# Patient Record
Sex: Male | Born: 1950 | Race: Black or African American | Hispanic: No | Marital: Married | Smoking: Never smoker
Health system: Southern US, Community
[De-identification: ages and names within clinical notes are randomized; demographics above are authoritative.]

## PROBLEM LIST (undated history)

## (undated) DIAGNOSIS — Z5189 Encounter for other specified aftercare: Secondary | ICD-10-CM

## (undated) DIAGNOSIS — I10 Essential (primary) hypertension: Secondary | ICD-10-CM

## (undated) DIAGNOSIS — Z940 Kidney transplant status: Secondary | ICD-10-CM

## (undated) DIAGNOSIS — E119 Type 2 diabetes mellitus without complications: Secondary | ICD-10-CM

## (undated) DIAGNOSIS — H269 Unspecified cataract: Secondary | ICD-10-CM

## (undated) HISTORY — DX: Encounter for other specified aftercare: Z51.89

## (undated) HISTORY — DX: Kidney transplant status: Z94.0

## (undated) HISTORY — PX: COLONOSCOPY: SHX174

## (undated) HISTORY — DX: Essential (primary) hypertension: I10

## (undated) HISTORY — DX: Unspecified cataract: H26.9

## (undated) HISTORY — DX: Type 2 diabetes mellitus without complications: E11.9

---

## 1997-09-09 ENCOUNTER — Other Ambulatory Visit: Admission: RE | Admit: 1997-09-09 | Discharge: 1997-09-09 | Payer: Self-pay | Admitting: *Deleted

## 1997-09-11 ENCOUNTER — Other Ambulatory Visit: Admission: RE | Admit: 1997-09-11 | Discharge: 1997-09-11 | Payer: Self-pay | Admitting: *Deleted

## 1997-09-12 ENCOUNTER — Emergency Department (HOSPITAL_COMMUNITY): Admission: EM | Admit: 1997-09-12 | Discharge: 1997-09-12 | Payer: Self-pay | Admitting: Emergency Medicine

## 1997-09-24 ENCOUNTER — Other Ambulatory Visit: Admission: RE | Admit: 1997-09-24 | Discharge: 1997-09-24 | Payer: Self-pay | Admitting: *Deleted

## 2001-03-19 ENCOUNTER — Inpatient Hospital Stay (HOSPITAL_COMMUNITY): Admission: AD | Admit: 2001-03-19 | Discharge: 2001-03-22 | Payer: Self-pay | Admitting: Nephrology

## 2001-03-20 ENCOUNTER — Encounter: Payer: Self-pay | Admitting: Nephrology

## 2001-11-07 ENCOUNTER — Ambulatory Visit (HOSPITAL_COMMUNITY): Admission: RE | Admit: 2001-11-07 | Discharge: 2001-11-07 | Payer: Self-pay | Admitting: Nephrology

## 2001-11-07 ENCOUNTER — Encounter: Payer: Self-pay | Admitting: Nephrology

## 2002-04-08 ENCOUNTER — Ambulatory Visit (HOSPITAL_COMMUNITY): Admission: RE | Admit: 2002-04-08 | Discharge: 2002-04-08 | Payer: Self-pay | Admitting: Nephrology

## 2002-04-08 ENCOUNTER — Encounter: Payer: Self-pay | Admitting: Nephrology

## 2002-04-09 ENCOUNTER — Encounter: Payer: Self-pay | Admitting: Vascular Surgery

## 2002-04-09 ENCOUNTER — Ambulatory Visit (HOSPITAL_COMMUNITY): Admission: RE | Admit: 2002-04-09 | Discharge: 2002-04-09 | Payer: Self-pay | Admitting: Vascular Surgery

## 2002-06-03 ENCOUNTER — Encounter: Admission: RE | Admit: 2002-06-03 | Discharge: 2002-06-03 | Payer: Self-pay | Admitting: Nephrology

## 2002-06-03 ENCOUNTER — Encounter: Payer: Self-pay | Admitting: Nephrology

## 2002-07-29 ENCOUNTER — Inpatient Hospital Stay (HOSPITAL_COMMUNITY): Admission: RE | Admit: 2002-07-29 | Discharge: 2002-08-01 | Payer: Self-pay | Admitting: Urology

## 2002-07-29 ENCOUNTER — Encounter (INDEPENDENT_AMBULATORY_CARE_PROVIDER_SITE_OTHER): Payer: Self-pay | Admitting: *Deleted

## 2002-07-31 ENCOUNTER — Encounter: Payer: Self-pay | Admitting: Urology

## 2002-10-02 ENCOUNTER — Encounter: Payer: Self-pay | Admitting: Nephrology

## 2002-10-02 ENCOUNTER — Ambulatory Visit (HOSPITAL_COMMUNITY): Admission: RE | Admit: 2002-10-02 | Discharge: 2002-10-02 | Payer: Self-pay | Admitting: Nephrology

## 2004-10-11 ENCOUNTER — Ambulatory Visit (HOSPITAL_COMMUNITY): Admission: RE | Admit: 2004-10-11 | Discharge: 2004-10-11 | Payer: Self-pay

## 2004-10-12 ENCOUNTER — Observation Stay (HOSPITAL_COMMUNITY): Admission: EM | Admit: 2004-10-12 | Discharge: 2004-10-13 | Payer: Self-pay | Admitting: Emergency Medicine

## 2004-10-12 ENCOUNTER — Ambulatory Visit: Payer: Self-pay | Admitting: Internal Medicine

## 2004-10-18 ENCOUNTER — Ambulatory Visit: Payer: Self-pay

## 2004-12-13 ENCOUNTER — Ambulatory Visit (HOSPITAL_COMMUNITY): Admission: RE | Admit: 2004-12-13 | Discharge: 2004-12-13 | Payer: Self-pay | Admitting: Nephrology

## 2005-01-24 ENCOUNTER — Ambulatory Visit (HOSPITAL_COMMUNITY): Admission: RE | Admit: 2005-01-24 | Discharge: 2005-01-24 | Payer: Self-pay | Admitting: Vascular Surgery

## 2005-02-21 ENCOUNTER — Ambulatory Visit: Payer: Self-pay | Admitting: Gastroenterology

## 2005-04-04 ENCOUNTER — Ambulatory Visit: Payer: Self-pay | Admitting: Gastroenterology

## 2005-05-18 ENCOUNTER — Emergency Department (HOSPITAL_COMMUNITY): Admission: EM | Admit: 2005-05-18 | Discharge: 2005-05-19 | Payer: Self-pay | Admitting: Emergency Medicine

## 2005-05-24 ENCOUNTER — Encounter: Admission: RE | Admit: 2005-05-24 | Discharge: 2005-07-20 | Payer: Self-pay | Admitting: Family Medicine

## 2005-05-25 ENCOUNTER — Encounter: Admission: RE | Admit: 2005-05-25 | Discharge: 2005-05-25 | Payer: Self-pay | Admitting: Family Medicine

## 2005-05-30 ENCOUNTER — Ambulatory Visit: Payer: Self-pay | Admitting: Gastroenterology

## 2005-07-06 ENCOUNTER — Ambulatory Visit (HOSPITAL_COMMUNITY): Admission: RE | Admit: 2005-07-06 | Discharge: 2005-07-06 | Payer: Self-pay | Admitting: Vascular Surgery

## 2005-07-25 ENCOUNTER — Ambulatory Visit (HOSPITAL_COMMUNITY): Admission: RE | Admit: 2005-07-25 | Discharge: 2005-07-25 | Payer: Self-pay | Admitting: Vascular Surgery

## 2006-07-31 ENCOUNTER — Encounter: Admission: RE | Admit: 2006-07-31 | Discharge: 2006-07-31 | Payer: Self-pay | Admitting: Family Medicine

## 2006-09-18 ENCOUNTER — Ambulatory Visit (HOSPITAL_COMMUNITY): Admission: RE | Admit: 2006-09-18 | Discharge: 2006-09-18 | Payer: Self-pay | Admitting: Nephrology

## 2006-11-13 ENCOUNTER — Encounter: Payer: Self-pay | Admitting: Nephrology

## 2006-11-13 ENCOUNTER — Ambulatory Visit: Payer: Self-pay | Admitting: Vascular Surgery

## 2006-11-13 ENCOUNTER — Ambulatory Visit (HOSPITAL_COMMUNITY): Admission: RE | Admit: 2006-11-13 | Discharge: 2006-11-13 | Payer: Self-pay | Admitting: Vascular Surgery

## 2006-11-23 ENCOUNTER — Ambulatory Visit: Payer: Self-pay | Admitting: Vascular Surgery

## 2006-12-06 ENCOUNTER — Ambulatory Visit (HOSPITAL_COMMUNITY): Admission: RE | Admit: 2006-12-06 | Discharge: 2006-12-06 | Payer: Self-pay | Admitting: Vascular Surgery

## 2006-12-14 ENCOUNTER — Ambulatory Visit: Payer: Self-pay | Admitting: Vascular Surgery

## 2007-01-10 ENCOUNTER — Ambulatory Visit: Payer: Self-pay | Admitting: Vascular Surgery

## 2007-01-10 ENCOUNTER — Ambulatory Visit (HOSPITAL_COMMUNITY): Admission: RE | Admit: 2007-01-10 | Discharge: 2007-01-10 | Payer: Self-pay | Admitting: Vascular Surgery
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
## 2007-02-25 ENCOUNTER — Ambulatory Visit (HOSPITAL_COMMUNITY): Admission: RE | Admit: 2007-02-25 | Discharge: 2007-02-25 | Payer: Self-pay | Admitting: Nephrology

## 2007-04-25 DIAGNOSIS — Z940 Kidney transplant status: Secondary | ICD-10-CM

## 2007-04-25 HISTORY — DX: Kidney transplant status: Z94.0

## 2007-06-18 ENCOUNTER — Ambulatory Visit: Payer: Self-pay

## 2007-07-31 ENCOUNTER — Ambulatory Visit (HOSPITAL_COMMUNITY): Admission: RE | Admit: 2007-07-31 | Discharge: 2007-07-31 | Payer: Self-pay | Admitting: Nephrology

## 2007-08-16 HISTORY — PX: KIDNEY TRANSPLANT: SHX239

## 2008-05-19 ENCOUNTER — Ambulatory Visit (HOSPITAL_COMMUNITY): Admission: RE | Admit: 2008-05-19 | Discharge: 2008-05-19 | Payer: Self-pay | Admitting: Nephrology

## 2008-12-03 ENCOUNTER — Encounter: Admission: RE | Admit: 2008-12-03 | Discharge: 2008-12-22 | Payer: Self-pay | Admitting: Nephrology

## 2010-09-06 NOTE — Op Note (Signed)
NAMETEODOR, PRATER                ACCOUNT NO.:  [id number]   MEDICAL RECORD NO.:  [id number]          PATIENT TYPE:  AMB   LOCATION:  SDS                          FACILITY:  [facility]   PHYSICIAN:  Larina Earthly, M.D.    DATE OF BIRTH:  1950/06/25   DATE OF PROCEDURE:  11/13/2006  DATE OF DISCHARGE:  11/13/2006                               OPERATIVE REPORT   PREOPERATIVE DIAGNOSIS:  End stage renal disease with occluded nonviable  left upper arm arteriovenous fistula.   POSTOPERATIVE DIAGNOSIS:  End stage renal disease with occluded  nonviable left upper arm arteriovenous fistula.   PROCEDURE:  Placement of left internal jugular Diatek catheter with  ultrasound visualization.   SURGEON:  Larina Earthly, M.D.   ASSISTANT:  Nurse   ANESTHESIA:  MAC.   COMPLICATIONS:  None.   DISPOSITION:  To the recovery room stable with chest x-ray pending.   PROCEDURE IN DETAIL:  The patient was taken to the operating room and  placed in the supine position where the area of the right and left neck  were imaged with ultrasound.  The patient had patent jugular veins  bilaterally.  On reviewing a prior right venous shuntogram, the patient  was found to have a stent across his IJ on the right and, therefore, was  not a candidate for right IJ catheter.  A 32 cm catheter was chosen.  The patient was placed in Trendelenburg and, using a finder needle, the  left internal jugular vein was identified and, using Seldinger  technique, a guidewire passed down to the level of the right atrium.  The dilator and peel away sheath was passed over the guide-wire, the  dilator and guidewire were removed.  A 32 cm Diatek catheter was passed  through the peel away sheath down to the level of the right atrium.  The  catheter was brought through a subcutaneous tunnel through a separate  stab incision.  The two lumen ports were attached, both lumens flushed  and aspirated easily and were locked with 1000 per mL  heparin.  The  catheter was secured to the skin with 3-0 nylon stitch and the entry  site was closed with a 4-0 subcuticular Vicryl stitch.  A sterile  dressing was applied.  The patient was taken to the recovery room in  stable condition.      Larina Earthly, M.D.  Electronically Signed     TFE/MEDQ  D:  11/13/2006  T:  11/13/2006  Job:  [id number]

## 2010-09-06 NOTE — Op Note (Signed)
NAMESABASTIEN, TYLER                ACCOUNT NO.:  [id number]   MEDICAL RECORD NO.:  [id number]          PATIENT TYPE:  AMB   LOCATION:  SDS                          FACILITY:  [facility]   PHYSICIAN:  Larina Earthly, M.D.    DATE OF BIRTH:  19-Jan-1951   DATE OF PROCEDURE:  12/06/2006  DATE OF DISCHARGE:  12/06/2006                               OPERATIVE REPORT   PREOPERATIVE DIAGNOSIS:  End-stage renal disease with non-functioning  left upper arm arteriovenous Gore-Tex graft.   POSTOPERATIVE DIAGNOSIS:  End-stage renal disease with non-functioning  left upper arm arteriovenous Gore-Tex graft.   PROCEDURE PERFORMED:  Placement of new left forearm loop arteriovenous  Gore-Tex graft.   SURGEON:  Larina Earthly, M.D.   ASSISTANT:  Nurse.   ANESTHESIA:  Monitored anesthesia control.   COMPLICATIONS:  None.   DISPOSITION:  To recovery room stable.   PROCEDURE IN DETAIL:  The patient was taken to the operating room and  placed in the supine position where the area of the left arm was prepped  and draped in the usual sterile fashion.  An incision was made over the  prior antecubital incision and carried down to isolate the basilic vein  which was of excellent caliber and also identification of the brachial  artery with prior cephalic vein anastomosis.  The patient had an  occluded cephalic vein fistula.  The artery was encircled proximal and  distal to the old anastomosis.  A separate incision was made over the  distal forearm and a loop configuration tunnel was created and a 6 mm  standard wall stretch graft was brought through the tunnel.  The basilic  vein was occluded proximal and distally and was opened with a #11 blade  and sewn with Potts scissors.  The graft was spatulated and sewn end-to-  side to the basilic vein with a running #6-0 Prolene suture.  Clamps  were removed.  The graft was flushed with heparinized saline and  reoccluded.  Next the brachial artery was occluded  proximal and distal  to the old fistula anastomosis.  The vein was ligated proximally and the  old anastomosis was taken down.  The graft was cut to the appropriate  length and sewn end-to-side to the brachial artery with a running #6-0  Prolene suture.  Clamps were removed. An excellent thrill was noted.  The wound was irrigated with saline.  Hemostasis with electrocautery.  The wounds were closed with #3-0 Vicryl in the subcutaneous and  subcuticular tissue.  Steri-Strips were applied.     Larina Earthly, M.D.  Electronically Signed    TFE/MEDQ  D:  12/06/2006  T:  12/07/2006  Job:  [id number]

## 2010-09-06 NOTE — Assessment & Plan Note (Signed)
OFFICE VISIT   ANGAD, NABERS  DOB:  Jul 27, 1950                                       12/14/2006  JYNWG#:[id number]   Edwin Huang comes to [place] today for followup of placement of left forearm new  AV Gore-Tex graft on August 14.  He has excellent flow through this with  an excellent thrill.  He does not have any discomfort related to this.  I am quite pleased with his initial result.  He does have a Diatek  catheter and I have instructed him to wait for access for his graft from  3-4 weeks, and then access it and we can get his catheter out.   Larina Earthly, M.D.  Electronically Signed   TFE/MEDQ  D:  12/14/2006  T:  12/17/2006  Job:  [id number]   cc:   [employer]

## 2010-09-09 NOTE — H&P (Signed)
[place]  Patient:    Edwin Huang, Edwin Huang Visit Number: [id number] MRN: [id number]          Service Type: EMS Location: [facility] Attending Physician:  Donnetta Hutching Dictated by:   Irena Cords, M.D. Admit Date:  03/19/2001                           History and Physical  ADMITTING DIAGNOSIS:  Acute renal failure.  CHIEF COMPLAINT:  Nausea, vomiting, fevers, chills, and hip pain.  HISTORY OF PRESENT ILLNESS:  Mr. Broce is a 60 year old African-American male with a past medical history significant for end-stage renal disease secondary to hypertension, status post cadaver kidney transplant performed in [place], [place] in 1996.  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  He has followed up with medical care sporadically over the last two years and he now presents with a six-day history of nausea, vomiting, anorexia, fevers, chills, and diffuse arthralgias that are worse in his hips, elbows, and knees. The patient reports that it has been difficult for him to walk due to his pain but he feels very weak and tired and does not feel well.  The patient was seen at [facility] today, and his labs upon workup revealed a potassium of 6.6, BUN of 140, and a creatinine of 23.4.  He was referred to [facility] for further evaluation and management as well as hospitalization.  ALLERGIES:  PENICILLIN which causes itching.  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  Hypertension. Secondary hyperparathyroidism.  CURRENT MEDICATIONS: 1. Prograf 6 mg b.i.d. 2. Prednisone  5 mg q.d. 3. Minoxidil 5 mg b.i.d. 4. Imuran 125 mg q.d. 5. Hectorol 2.5 mg q.d.  FAMILY HISTORY:  Mother is alive at age 36 and in excellent health.  Father died at age 56 secondary to bleeding ulcer.  He has three brothers and one sister and they are all alive.  He does have a family history for hypertension and coronary disease.  No family history for diabetes or kidney disease.  SOCIAL HISTORY:  He is married with three children.  He is a Sport and exercise psychologist.  Denies tobacco, alcohol, or drug use.  He has had blood transfusions in the past.  REVIEW OF SYSTEMS:  GENERAL:  As per HPI, nausea, vomiting, anorexia, and fatigue.  OPHTHALMIC:  Denies any blurred vision, photophobia, or diplopia. HEENT:  Denies any tinnitus or hearing impairment.  CARDIAC:  Denies any palpitations, chest pain, or PND.  PULMONARY:  Denies any hemoptysis, productive cough, or shortness of breath.  GASTROINTESTINAL:  Denies any hematemesis, hematochezia, melena, or bright red blood per rectum but does have the nausea, vomiting, anorexia.  GENITOURINARY:  Denies any dysuria, pyuria, or hematuria.  No decreased urine output.  No urinary retention or urgency.  No abdominal pain.  MUSCULOSKELETAL:  As per HPI.  Diffuse arthralgias along his hips, knees, elbows, shoulders.  No swollen joints.  No erythema.  DERMATOLOGIC:  Denies any rashes or masses.  NEUROLOGIC:  Denies any numbness, tingling, or weakness.  No dysarthria.  ENDOCRINE:  Denies any polyuria, polyphagia, or polydipsia.  All other systems negative.  PHYSICAL EXAMINATION:  GENERAL:  A well-developed, frail-appearing man in no apparent distress.  VITAL SIGNS:  Temperature 97.3, pulse 86, blood pressure 111/41, respiratory rate 18.  HEENT:  Head is normocephalic and atraumatic.  Pupils are equal, round and reactive to light.  Extraocular muscles intact.  NECK:  Supple with full range of motion.  No lymphadenopathy and no bruits appreciated.  LUNGS:  Clear to  auscultation bilaterally.  No rales, rubs, or rhonchi.  CARDIOVASCULAR:  Regular rate and rhythm with an S4 gallop.  A 2/6 systolic ejection murmur at the right upper sternal border.  ABDOMEN:  Normoactive bowel sounds.  Soft.  His allograft is in the right lower quadrant and is mildly tender and enlarged.  There are no bruits appreciated over this.  EXTREMITIES:  He has no clubbing, cyanosis, or edema.  He has a functioning right forearm AV fistula.  He has pain on internal and external rotation of his hips as well as flexion and extension of his knees and elbows.  There is no erythema.  No point tenderness.  LABORATORY DATA:  Sodium of 139, potassium of 6.6, chloride 111, CO2 9, BUN 140, creatinine 23.4.  Glucose 96, calcium 9.1, phosphorus 12.2, albumin 4.5. Hemoglobin of 8.2, hematocrit 25.7, white blood cell count 9.3, platelets of 295,000, MCV of 79.  ASSESSMENT/PLAN: 1. Acute renal failure on chronic renal insufficiency, possibly secondary to    acute on chronic rejection versus worsening of chronic rejection and    hypertension:  We will continue with his Prograf and Imuran for now and    start intravenous Solu-Medrol.  Plan for urgent hemodialysis at this time    for correction of hyperkalemia and acidosis.  We will continue to follow    his laboratory values and send off his Prograf level. 2. Joint pain, diffuse:  The patient is afebrile with a normal white blood    cell counts; however, we will check blood cultures to rule out infectious    etiology.  This may be secondary to an immune reaction due to his rejection    versus elevated uric acid from the acute renal failure or avascular    necrosis secondary to prednisone therapy and immunosuppression compromised    status.  At this time, we will check x-rays of his hips as well as blood    cultures and use oxycodone as needed for pain. 3. Abdominal pain and enlarged allograft:  Worrisome for rejection.  We will    check  abdominal and pelvic computerized tomography to further evaluate    renal transplant, rule out rupture.  4. Anemia:  This is likely related to decrease renal function.  However, we    will check stool guaiacs and iron studies.  We will type and cross two    units at this time and follow his hemoglobin levels.  We will also start    Epogen therapy. 5. Secondary hyperparathyroidism:  We will start phosphate binders given his    elevated phosphorus of 12.2, put him on a low phosphorus diet and check    an intact PTH level.  We will hold his Hectorol for now and consider    intravenous Calcitriol when PTH levels have returned.  Dictated by:   Irena Cords, M.D. Attending Physician:  Donnetta Hutching DD:  03/19/01 TD:  03/19/01 Job: [id number]

## 2010-09-09 NOTE — Op Note (Signed)
NAMEAMIRR, ACHORD                ACCOUNT NO.:  [id number]   MEDICAL RECORD NO.:  [id number]          PATIENT TYPE:  AMB   LOCATION:  SDS                          FACILITY:  [facility]   PHYSICIAN:  Balinda Quails, M.D.    DATE OF BIRTH:  May 10, 1950   DATE OF PROCEDURE:  07/25/2005  DATE OF DISCHARGE:                                 OPERATIVE REPORT   SURGEON:  Balinda Quails, M.D.   ASSISTANT:  Rowe Clack, P.A.-C.   ANESTHETIC:  Local with MAC.   ANESTHESIOLOGIST:  Judie Petit, M.D.   PREOPERATIVE DIAGNOSIS:  End stage renal failure.   POSTOPERATIVE DIAGNOSIS:  End stage renal failure.   PROCEDURE:  Left brachial cephalic arteriovenous fistula.   OPERATIVE PROCEDURE:  The patient was brought to the operating room in  stable condition.  He was placed in the supine position.  The left arm was  prepped and draped in sterile fashion.  The skin and subcutaneous tissue was  instilled with 1% Xylocaine with epinephrine.  A transverse skin incision  was made through the left antecubital fossa.  Dissection was carried down  through the subcutaneous tissue.  The antecubital network of veins were  identified.  The cephalic vein was mobilized proximally in the arm.  The  cephalic vein was ligated in the antecubital fossa and divided.  This was a  large vein of good quality.  It was flushed with heparin saline solution and  controlled with a bulldog clamp.  Deep dissection was then carried down  exposing the brachial artery which was freed proximally and distally.  The  patient was administered 3000 units heparin intravenously.  The brachial  artery was controlled with bulldog clamps.  A longitudinal arteriotomy was  made.  The cephalic vein was anastomosed end-to-side to the brachial artery  using running 7-0 Prolene suture.  Clamps were removed.  Excellent flow  present.  Adequate hemostasis obtained.  Sponges counts correct.  Subcutaneous tissue closed with a running 3-0 Vicryl  suture.  Skin was  closed with 4-0 Monocryl.  Steri-Strips applied.  The patient tolerated  procedure well.  Transferred recovery in stable condition.      Balinda Quails, M.D.  Electronically Signed     PGH/MEDQ  D:  07/25/2005  T:  07/25/2005  Job:  [id number]

## 2010-09-09 NOTE — H&P (Signed)
NAMESAKIB, NOGUEZ                ACCOUNT NO.:  [id number]   MEDICAL RECORD NO.:  [id number]          PATIENT TYPE:  OBV   LOCATION:  4707                         FACILITY:  [facility]   PHYSICIAN:  Doylene Canning. Ladona Ridgel, M.D.  DATE OF BIRTH:  1951-03-14   DATE OF ADMISSION:  10/12/2004  DATE OF DISCHARGE:                                HISTORY & PHYSICAL   CHIEF COMPLAINT:  Chest pain.   HISTORY OF PRESENT ILLNESS:  The patient is a 60 year old man with a history  of end-stage renal disease on hemodialysis. This is thought secondary to  hypertension. 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 The patient was seen one day ago for revision of his right  forearm graft. Following that, he had some nausea and a funny feeling in my  stomach. He also complained of lots of gas at that time. He underwent his  typical hemodialysis today but developed epigastric and substernal chest  pain following dialysis. He notes that he had three distinct episodes of  pain, the longest of which was perhaps 5 or at most 10 minutes. These  episodes were midsternal to epigastric in the location. There was no  radiation. There was no associated nausea, vomiting or diaphoresis. There  was a questionable increase work of breathing or sensation of dyspnea. The  patient is now admitted for additional evaluation. He denies any history of  syncope.   PAST MEDICAL HISTORY:  1.  As noted above.  2.  He also has had a history of secondary hyperparathyroidism.  3.  He has a history of anemia.  4.  He has a history of peritoneal dialysis.  5.  He has a history of transplant nephropathy in 2004.  6.  He has longstanding hypertension.   ALLERGIES:  The patient gives a history of PENICILLIN allergy.   MEDICATIONS:  Nephro vitamins and Tums.   SOCIAL HISTORY:  The patient lives in [place]. He has a history of  tobacco  use, stopping smoking approximately 15 years ago. He denies alcohol  abuse.   FAMILY HISTORY:  His family history is notable for father dying of unknown  causes and a mother dying of unknown causes. He has one brother who has  coronary disease and is status post bypass surgery in his 58s.   REVIEW OF SYMPTOMS:  As noted in the HPI. In addition, he denies vision or  hearing problems. He denies nausea, vomiting, diarrhea, or constipation  except as previously noted. He denies polyuria, polydipsia, heat, or cold  intolerance. He denies any recent weight changes. He denies any skin  problems. He denies any arthritic complaints. He denies chest pain except  for what may have been mentioned previously. He denies hemoptysis, cough,  syncope, or palpitations. He denies PND or orthopnea. He denies any  neurologic complaints. The rest of his review of systems was reviewed and  found to be negative.   PHYSICAL EXAMINATION:  GENERAL:  He is a pleasant 60 year old man in no  distress.  VITAL SIGNS:  Blood pressure was 100/60, pulse was 68 and regular,  respirations were 18, temperature was 98.  HEENT:  Normocephalic and atraumatic. Pupils are equal and round. The  oropharynx is moist. Sclerae are anicteric.  NECK:  Supple with no jugular venous distention. The thyroid was not  appreciably enlarged. The carotids were 2+ and symmetric. The trachea was  midline.  LUNGS:  Clear bilaterally to auscultation. There were no wheezes, rales, or  rhonchi and no increase work of breathing.  CARDIOVASCULAR:  Regular rate and rhythm with normal S1 and S2. I did not  appreciate an S3 or S4 gallop nor any murmurs today. His PMI was not  enlarged nor laterally displaced.  ABDOMEN:  Soft, nontender, and nondistended. There was no organomegaly.  There was no rebound or guarding. Bowel sounds were present.  EXTREMITIES:  Demonstrated no clubbing, cyanosis, or edema. The pulses were  2+ and symmetric.   LABORATORY  DATA:  The EKG demonstrates sinus tachycardia at a 108 beats per  minute. There are no acute ST or T-wave abnormalities. Initial labs were  unremarkable except for a creatinine of 9.7. His initial point of care  cardiac makers were notable for a myoglobin greater than 500. His initial CK  and MB were normal.   IMPRESSION:  1.  Atypical chest pain in a patient with multiple cardiac risk factors.  2.  End-stage renal disease on hemodialysis.  3.  Status post renal transplant with transplant nephrectomy.   PLAN:  Admit the patient to the hospital and obtain serial cardiac enzymes.  If his cardiac enzymes are negative, then we will plan to discharge him home  with an outpatient stress test. If his cardiac enzymes turn positive, then  catheterization will be warranted.       GWT/MEDQ  D:  10/12/2004  T:  10/12/2004  Job:  [id number]   cc:   Tamika J. Lazarus Salines, M.D.  Fam. Med - Resident - [place], [place] [address]  Fax: [phone]

## 2010-09-09 NOTE — Op Note (Signed)
Edwin Huang, Edwin Huang                            ACCOUNT NO.:  [id number]   MEDICAL RECORD NO.:  [id number]                   PATIENT TYPE:  INP   LOCATION:  5530                                 FACILITY:  [facility]   PHYSICIAN:  Maretta Bees. Vonita Moss, M.D.             DATE OF BIRTH:  04/29/1950   DATE OF PROCEDURE:  07/29/2002  DATE OF DISCHARGE:                                 OPERATIVE REPORT   PREOPERATIVE DIAGNOSIS:  Renal transplant with rejection and hematuria.   POSTOPERATIVE DIAGNOSIS:  Renal transplant with rejection and hematuria.  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   OPERATION PERFORMED:  Transplant nephrectomy and flexible cystoscopy.   SURGEON:  Maretta Bees. Vonita Moss, M.D.   ASSISTANT:  Di Kindle. Edilia Bo, M.D.   ANESTHESIA:  General.   INDICATIONS FOR PROCEDURE:  This 60 year old black male had a renal  transplant performed at the [facility] [place] [facility] in 1995.  Over  a year ago he developed rejection with pain.  He also has had some problems  with hematuria.  He was seen in consultation for transplant nephrectomy and  that was scheduled for today.  With the hematuria it was felt that  cystoscopy as part of the evaluation was appropriate.   DESCRIPTION OF PROCEDURE:  The patient was brought to the operating room and  placed in supine position and external genitalia were prepped and draped in  the usual fashion and he was cystoscoped with a flexible scope.  The  anterior urethra and prostatic urethra were unremarkable.  The bladder had  no stones, tumors, inflammatory lesions and the cystoscope was removed.  The  lower abdomen and external genitalia were reprepped and Foley catheter  inserted.  An incision was made through his previous renal transplant  incision in the right lower quadrant.  As expected, very dense adherent  fibrosis and scarring were encountered and dissection was carried down to  the renal capsule and the anterior  surface of the kidney dissected down with  a combination of sharp and blunt dissection to what appeared to be at first  renal pelvis.  There was a renal cyst and above that there were two vessels  that were dissected out by Dr. Edilia Bo and divided and subsequently ligated  on each side with 2-0 silk tie and 5-0 vascular suture with good hemostasis.  During the course of the dissection, there was felt to be two major branches  of the renal artery.  Dissection laterally was used to mobilize the rest of  the kidney and the ureter was identified and divided.  Dissection was then  turned distally where a large venous structure was noted and it was  dissected superiorly towards this cystic renal mass in the kidney and this  mass was mobilized and dissected off the external iliac vein and the only  way it could be removed was with excision, sharp dissection using  the  Satinsky clamp and Dr. Merlene Pulling the vein at this point with running  5-0 Prolenes.  It is felt that the cystic structure was either renal cyst  that was adherent to the vein or possibly some type of distended venous  abnormality.  At this point the specimen was removed intact and his right  external iliac artery and vein were examined and found to be in situ without  any abnormalities.  At this point, the wound was irrigated and the fascial  closure was completed with running #1 PDS.  Subcutaneous tissue closed with  interrupted 3-0 chromic catgut and incision closed with skin  staples.  The wound was cleaned, dressed with dry sterile gauze dressings.  Sponge, needle and instrument counts were correct.  The estimated blood loss  was approximately 100 ml.  He was taken to recovery room in good condition  having tolerated the procedure well.                                                Maretta Bees. Vonita Moss, M.D.    LJP/MEDQ  D:  07/29/2002  T:  07/29/2002  Job:  [id number]   cc:   Di Kindle. Edilia Bo, M.D.  [address]  [place]  [place] [address]  Fax: [phone]   Duke Salvia. Eliott Nine, M.D.  [address]  [place]  [place] [address]  Fax: [phone]

## 2010-09-09 NOTE — Discharge Summary (Signed)
Edwin Huang, Edwin Huang                            ACCOUNT NO.:  [id number]   MEDICAL RECORD NO.:  [id number]                   PATIENT TYPE:  INP   LOCATION:  5528                                 FACILITY:  [facility]   PHYSICIAN:  Maretta Bees. Vonita Moss, M.D.             DATE OF BIRTH:  [birth year]   DATE OF ADMISSION:  07/29/2002  DATE OF DISCHARGE:  08/01/2002                                 DISCHARGE SUMMARY   FINAL DIAGNOSES:  1. Renal transplant rejection.  2. Chronic renal failure.  3. Hematuria.  4. Hypertension.  5. Pseudoaneurysmal perirenal cyst.   PROCEDURE:  Transplant nephrectomy and cystoscopy, July 29, 2002.   HISTORY:  This 60 year old black male had a renal transplant at the  [facility] [place], 1995.  A year ago, he developed kidney pain  and rejection, then he developed hematuria.  He was admitted at this time  for transplant nephrectomy, to be eligible for retransplant and also  cystoscopy to complete a hematuria workup.   PHYSICAL EXAMINATION:  He has an AV fistula in his right arm and a right  lower quadrant mass consistent with a transplant kidney.   HOSPITAL COURSE:  After admission, he was taken to surgery, and cystoscopy  revealed no bladder lesions.  He then underwent transplant nephrectomy and  in the course of dissection, there was a perirenal cystic structure that  pathologically turned out to be an aneurysm or pseudoaneurysm of, probably,  vascular origin.  Postoperatively, he did very nicely except for one night  after surgery, he had a temperature spike.  He was otherwise asymptomatic  and without localizing findings.  His white count at that time was only 640.  Chest x-ray revealed some streaky atelectasis but no evidence of pneumonia.  He was put on Tequin p.o., and his fever came down promptly, and he  underwent dialysis while he was in the hospital and was afebrile, feeling  well, ambulating well, tolerating his diet well, and ready for  discharge on  August 01, 2002.  He will go home on renal failure diet and light activity for  6 weeks.   DISCHARGE MEDICATIONS:  Nephro-Vite, Tums E-X, Tequin 200 mg a day for 3  days, Tylox for pain.    DISPOSITION:  He was sent home in good condition.  He will return to the  dialysis center for his usual schedule and will see me in 1 week for  followup in the office for postoperative care and skin staple removal.                                               Maretta Bees. Vonita Moss, M.D.    LJP/MEDQ  D:  08/01/2002  T:  08/02/2002  Job:  [id number]   cc:  Duke Salvia Eliott Nine, M.D.  [address]  [place]  [place] [address]  Fax: [phone]   Di Kindle. Edilia Bo, M.D.  [address]  [place]  [place] [address]  Fax: [phone]

## 2010-09-09 NOTE — H&P (Signed)
   NAMEJUANDEDIOS, DUDASH                            ACCOUNT NO.:  [id number]   MEDICAL RECORD NO.:  [id number]                   PATIENT TYPE:  INP   LOCATION:  5530                                 FACILITY:  [facility]   PHYSICIAN:  Maretta Bees. Vonita Moss, M.D.             DATE OF BIRTH:  06-26-1950   DATE OF ADMISSION:  07/29/2002  DATE OF DISCHARGE:                                HISTORY & PHYSICAL   HISTORY:  This 60 year old black male had a history of hypertensive renal  failure, and a renal transplant was performed at the [facility] [place]  at [place] in 1995.  A year ago he developed rejection with kidney pain.  He  has also had some hematuria.  He is back on dialysis.  He is brought to the  hospital today for a transplant nephrectomy and cystoscopy for complaint of  hematuria with that.   PAST MEDICAL HISTORY:  1. Renal failure.  2. Hypertension.   PAST SURGICAL HISTORY:  1. Kidney transplant as noted above.  2. Right A-V fistula in the arm in 1994.  3. Thrombectomy of fistula in December 2003.   MEDICATIONS:  Nephro-Vite and Tums.   ALLERGIES:  PENICILLIN.   SOCIAL HISTORY:  Alcohol: None.  Tobacco: None.   FAMILY HISTORY:  Noncontributory.   REVIEW OF SYSTEMS:  Noncontributory.   PHYSICAL EXAMINATION:  GENERAL:  He is a pleasant black man in no acute  distress.  Alert and oriented x 3.  VITAL SIGNS:  Blood pressure 112/78, pulse 98, temperature 98.3.  NECK:  Supple.  EXTREMITIES:  He has an A-V fistula in his right arm.  CHEST:  Clear, heart tones regular.  ABDOMEN:  Noted to have a right lower quadrant mass consistent with  transplant infection.  GENITOURINARY:  External genitalia unremarkable.   IMPRESSION:  1. Rejection transplanted kidney.  2. Hematuria.  3.     Renal failure.  4. History of hypertension.   PLAN:  Cystoscopy and transplant nephrectomy.                                                Maretta Bees. Vonita Moss, M.D.    LJP/MEDQ  D:   07/29/2002  T:  07/29/2002  Job:  [id number]   cc:   Di Kindle. Edilia Bo, M.D.  [address]  [place]  [place] [address]  Fax: [phone]   Duke Salvia. Eliott Nine, M.D.  [address]  [place]  [place] [address]  Fax: [phone]

## 2010-09-09 NOTE — Consult Note (Signed)
Edwin Huang, Edwin Huang                            ACCOUNT NO.:  [id number]   MEDICAL RECORD NO.:  [id number]                   PATIENT TYPE:  INP   LOCATION:  5528                                 FACILITY:  [facility]   PHYSICIAN:  Tamika J. Lazarus Salines, M.D.                DATE OF BIRTH:  [birth year]   DATE OF CONSULTATION:  DATE OF DISCHARGE:                                   CONSULTATION   REASON FOR CONSULTATION:  Hemodialysis management.   HISTORY OF PRESENT ILLNESS:  The patient is a 60 year old African American  male with a history of end-stage renal disease secondary to hypertension,  status post renal transplant in 1995.  He is on chronic hemodialysis every  Monday, Wednesday, and Friday at the [facility].  The patient  underwent a transplant nephrectomy today secondary to transplant failure.   PAST MEDICAL HISTORY:  1. End-stage renal disease.  2. Hypertension.  3. Status post renal transplant in 1995.  4. Anemia.  5. Secondary hyperparathyroidism.   MEDICATIONS:  1. Nephro-Vite daily.  2. Tums EX 2 with meals t.i.d.   ALLERGIES:  PENICILLIN.   SOCIAL HISTORY:  He is married and lives at home with his wife and youngest  son.  He has two adult children not living at home.  Denies tobacco,  alcohol, or drug use.   FAMILY HISTORY:  His mom is alive and well at age 3.  His father died at  age 45 with history of hypertension and bleeding ulcer.   REVIEW OF SYSTEMS:  He reports a good appetite.  No fever, no chills, no  nausea or vomiting or diarrhea.  No palpitations, no chest pain, no  shortness of breath, no melena, no joint pain.   PHYSICAL EXAMINATION:  VITAL SIGNS:  Temperature 97, pulse 76, respiratory  rate 20, blood pressure 98/58.  GENERAL:  Pleasant, well-appearing, in no acute distress.  HEENT:  Extraocular movements intact.  Pupils equally round and reactive to  light bilaterally.  Oropharynx clear with moist mucous membranes.  NECK:  Supple.  LUNGS:   Clear to auscultation bilaterally.  HEART:  Regular rate and rhythm.  ABDOMEN:  Soft, nondistended, mildly tender on palpation.  Right lower  quadrant is bandaged from surgery.  Bowel sounds present.  EXTREMITIES:  No lower extremity edema.  AV fistula in right arm noted and  is warm and dry.  NEUROLOGIC:  Nonfocal.   LABORATORY DATA:  Sodium 138, potassium 3.9, chloride 96, bicarb 27, BUN 43,  creatinine 11.8, glucose 100, albumin 3.6, calcium 9.6, protein 8.7.  WBC  4.4, hemoglobin 13.2, hematocrit 40.8, platelets 355.   ASSESSMENT AND PLAN:  1. Postoperative day #0, status post transplant nephrectomy, stable     postoperatively.  2. End-stage renal disease.  We will continue hemodialysis Monday,     Wednesday, and Friday.  3. Hypertension.  Blood pressure has been well  controlled with volume and no     medications.  We will monitor.  4. Anemia.  Hemoglobin stable.  We will continue Epogen and InFeD with     hemodialysis.  5. Secondary hyperparathyroidism.  Continue management with Tums and     Calcitriol.                                               Tamika J. Lazarus Salines, M.D.    Nadara Eaton  D:  07/30/2002  T:  08/01/2002  Job:  [id number]

## 2010-09-09 NOTE — Discharge Summary (Signed)
[place]  Patient:    Edwin Huang, Edwin Huang Visit Number: [id number] MRN: [id number]          Service Type: MED Location: [phone] Attending Physician:  Lurlean Nanny Dictated by:   Amada Jupiter, P.A. Admit Date:  03/19/2001 Discharge Date: 03/22/2001   CC:         [facility]   Discharge Summary  ADMITTING DIAGNOSES: 1. Acute on chronic renal failure. 2. Diffuse arthralgias. 3. Cadaveric renal transplant with enlarged allograft. 4. Anemia of chronic disease. 5. Secondary hyperparathyroidism.  DISCHARGE DIAGNOSES: 1. 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 2. Diffuse arthralgias, resolved. 3. Cadaveric renal transplant with enlarged allograft.  Negative for    hydronephrosis. 4. Anemia of chronic disease. 5. Secondary hyperparathyroidism.  BRIEF HISTORY:  A 60 year old black male with end-stage renal disease secondary to hypertension, status post cadaveric kidney transplant in [place], [place] in 1996 that has been complicated by episodes of mild acute failure with rejection, as well as Prograf toxicity, who has had a baseline creatinine of 2-2.5 in the past.  His followup medical care has been sporadic over the last two years and he now presents with a six-day history of nausea, vomiting, anorexia, fevers, chills and diffuse arthralgias in his hips, elbows, and knees.  He reports he has had a difficult time walking due to pain and he feels very weak and tired.  He was seen at the [facility] office on the day of admission, which showed a potassium of 6.6, a BUN of 140, and a creatinine of 23.4.  Other labs on admission:  Sodium 139, chloride 111, CO2 9, glucose 96, calcium 9.1, phosphorus 12.2, albumin 4.5, hemoglobin 8.2, hematocrit 25.7.  White count 9300, platelets 295,000.  HOSPITAL COURSE:  The patient was placed on his usual Prograf and Imuran dose and  given stress doses of steroids in the form of Solu-Medrol IV.  He had a functioning fistula of his left arm.  Therefore, he underwent hemodialysis to correct his hyperkalemia.  He tolerated dialysis well.  Workup of his enlarged allograft included a CT scan which showed diffusely enlarged right pelvic transplant kidney with mild perinephric soft tissue stranding but without hydronephrosis.  This was consistent with transplant rejection.  He also had a 3.8 cm, smoothly marginated, rounded fluid collection posterior and inferior to the lower pole of the transplant kidney which could represent an exophytic cyst, urinoma, lymphocele, or seroma.  This was not pursued on this admission and we will follow up at a later date with a repeat CT.  Preparations were made for dialysis on a chronic basis, since there was no evidence of return of function and he remained anuric.  He was transfused 2 units of packed red blood cells for a hemoglobin of 7.4.  At time of discharge, it rose to 9.4. Iron studies were normal with a transferrin saturation level of 51%.  EPO was started on 10,000 units IV each dialysis.  With dialysis, his symptoms of weakness and arthralgias improved.  His Solu-Medrol was switched to prednisone on the third hospital day.  Stress doses were tapered from 60 mg down to 10 mg over the course of one week thereafter.  Arrangements were made for outpatient dialysis at [facility].  He was reinstructed on a hemodialysis diet.  He had plans of leaving for [place] on a job-related trip and he will be missing one outpatient dialysis.  We strongly  recommended not to do so; however, he is determined to go.  At the time of discharge, he was feeling much better.  His blood pressure was well controlled at 130/80 and his dry weight is estimated at 74 kg.  A PTH level was checked and returned elevated at 730.  Calcijex was started at 1.5 mcg IV each dialysis.  DISCHARGE  MEDICATIONS: 1. Prednisone 20 mg 3 daily for two days, then 2 daily for two days, then 1    daily for two days, then decrease to 10 mg. 2. Prograf 6 mg b.i.d. 3. Imuran 125 mg every morning. 4. Nephro-Vite vitamin daily. 5. Calcium carbonate 500 mg 2 with each meals. 6. Avandia 4 mg q.a.m. 7. Calcijex 1.5 mcg IV each dialysis. 8. Epogen 10,000 units IV each dialysis.  Dry weight 74 kg. Dictated by:   Amada Jupiter, P.A. Attending Physician:  Lurlean Nanny DD:  04/14/01 TD:  04/16/01 Job: [id number]

## 2010-09-09 NOTE — Op Note (Signed)
NAMEDENI, LEFEVER                ACCOUNT NO.:  [id number]   MEDICAL RECORD NO.:  [id number]          PATIENT TYPE:  AMB   LOCATION:  SDS                          FACILITY:  [facility]   PHYSICIAN:  Larina Earthly, M.D.    DATE OF BIRTH:  March 27, 1951   DATE OF PROCEDURE:  01/24/2005  DATE OF DISCHARGE:                                 OPERATIVE REPORT   PREOPERATIVE DIAGNOSIS:  End-stage renal disease.   POSTOPERATIVE DIAGNOSIS:  End-stage renal disease.   PROCEDURE:  Creation of left wrist Cimino fistula.   SURGEON:  Larina Earthly, M.D.   ASSISTANT:  Stephanie Acre Dominick, PA   ANESTHESIA:  LMA.   COMPLICATIONS:  None.   DISPOSITION:  To recovery room, stable.   PROCEDURE IN DETAIL:  The patient was taken to the operating room and placed  in a supine position, where the area of the left arm was prepped and draped  in the usual sterile fashion.  Incision was made using local anesthesia  between the level of cephalic and the radial artery at the wrist.  The  cephalic vein was mobilized and was of moderate size, and was patent  throughout the upper arm.  The vein was ligated distally and was divided and  mobilized to the level of the radial artery.  The radial artery was somewhat  calcified, but did have a good pulse.  The artery was occluded proximally  and distally, was opened with an 11 blade and extended longitudinally with  Potts scissors.  The vein was cut to the appropriate length, was spatulated  and sewn end-to-side to the vein with a running 6-0 Prolene suture.  A 2  dilator passed through the anastomosis and through the vein.  The  anastomosis was completed, clamps was removed and good thrill was noted.  The wound was irrigated with saline and hemostased with electrocautery.  The  wounds were closed with 3-0 Vicryl in the subcutaneous and subcuticular  tissue.  Benzoin and Steri-Strips were applied.      Larina Earthly, M.D.  Electronically Signed     TFE/MEDQ  D:   01/24/2005  T:  01/24/2005  Job:  [id number]

## 2010-09-09 NOTE — Consult Note (Signed)
Edwin Huang, Edwin Huang                ACCOUNT NO.:  [id number]   MEDICAL RECORD NO.:  [id number]          PATIENT TYPE:  OBV   LOCATION:  4707                         FACILITY:  [facility]   PHYSICIAN:  Di Kindle. Edilia Bo, M.D.DATE OF BIRTH:  17-Oct-1950   DATE OF CONSULTATION:  10/13/2004  DATE OF DISCHARGE:  10/13/2004                                   CONSULTATION   REASON FOR CONSULTATION:  To evaluate right forearm AV fistula.   HISTORY:  This is a 60 year old gentleman with a history of end-stage renal  disease on dialysis, who was admitted on October 12, 2004 with chest pain.  His  work-up was unremarkable and the patient is actually ready to be discharged  today.  The patient had had some problems with his right forearm AV fistula  and underwent a fistulogram which showed that there was a pseudoaneurysm in  the mid forearm with clot and this was successfully treated by the  radiologist with angioplasty and mechanical thrombectomy.  This is on October 12, 2004.  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  We were asked to  evaluate the fistula.  Of note, this was placed many years ago and most  recently worked on in December 2003 when we revised it by inserting the vein  into a more proximal segment of the radial artery and ligating a competing  branch.  It had been working well since then and the patient states that it  is currently still working adequately.   PAST MEDICAL HISTORY:  1.  Chronic renal insufficiency secondary to hypertension.  2.  History of secondary hyperparathyroidism.  3.  History of anemia.   MEDICATIONS:  Nephro-Vite and Tums.   SOCIAL HISTORY:  The patient quit tobacco 15 years ago.   FAMILY HISTORY:  Both of his parents died from unknown causes.   REVIEW OF SYSTEMS:  He had no chest pain today and no significant shortness  of breath.  He has had no fever or chills.   PHYSICAL  EXAMINATION:  VITAL SIGNS:  Blood pressure is 105/60, heart rate is  92.  EXTREMITIES:  He has a palpable thrill in his right forearm AV fistula and a  warm, well-perfused hand.  There is no significant arm swelling.  He has two  pseudoaneurysms of his AV fistula.   I have reviewed his fistulogram which shows two pseudoaneurysms of his  forearm AV fistula.  The cephalic vein above the elbow appears to be patent  but a subclavian stenosis as noted above was successfully ballooned and  stented.   IMPRESSION:  According to the patient, the fistula is functioning  adequately.  Regardless, in reviewing the fistulogram I do not see anything  to be done to the forearm fistula to revise this to improve its long-term  patency or  functioning.  If this graft clots he will need to be evaluated for possibly  an upper arm fistula on the right and a catheter versus an AV graft and  catheter.  He is not yet  had access on the left arm either.  Obviously, if  this graft clots we will proceed with placement of new access as I did not  think there is any way to revise his current fistula.       CSD/MEDQ  D:  10/13/2004  T:  10/13/2004  Job:  [id number]

## 2010-09-09 NOTE — Op Note (Signed)
   Edwin Huang, Edwin Huang                            ACCOUNT NO.:  [id number]   MEDICAL RECORD NO.:  [id number]                   PATIENT TYPE:  OIB   LOCATION:  2853                                 FACILITY:  [facility]   PHYSICIAN:  Di Kindle. Edilia Bo, M.D.        DATE OF BIRTH:  1950/09/21   DATE OF PROCEDURE:  04/09/2002  DATE OF DISCHARGE:                                 OPERATIVE REPORT   PREOPERATIVE DIAGNOSIS:  Nonfunctioning right forearm arteriovenous fistula.   POSTOPERATIVE DIAGNOSIS:  Nonfunctioning right forearm arteriovenous  fistula.   PROCEDURE:  Revision of right forearm arteriovenous fistula with  intraoperative fistulogram.   SURGEON:  Di Kindle. Edilia Bo, M.D.   ASSISTANT:  Nurse.   ANESTHESIA:  Local with sedation.   DESCRIPTION OF PROCEDURE:  The patient was taken to the operating room,  sedated by anesthesia.  The right upper extremity was prepped and draped in  the usual sterile fashion.  Based on the arteriogram which was done  yesterday, the distal radial artery was occluded.  However, the radial  artery proximally was patent and had a good pulse.  I therefore decided to  originate the fistula higher up on the radial artery.  An oblique incision  was made encompassing the cephalic vein and radial artery both.  This was  done after the skin was anesthetized.  The fistula was dissected free and  then ligated distally.  It irrigated up nicely with heparinized saline.  The  patient was heparinized, but I did pass a Fogarty catheter the entire length  without obstruction, and there was no clot retrieved.  Next the radial  artery was dissected free and had a good pulse.  It was clamped proximally  and distally and a longitudinal arteriotomy was made.  The vein was slightly  spatulated and sewn end-to-side to the artery using continuous 6-0 Prolene  suture.  At the completion there was a good thrill in the fistula.  A short  an intraoperative fistulogram,  which showed one large competing branch,  which I ligated through a separate small incision after the skin was  anesthetized.  This was done with a 2-0 silk.  The wrist incision was closed  with interrupted 3-0 Vicryls and then both the skin incisions were closed  with 4-0 Vicryl.  A sterile dressing was applied.  The patient tolerated the  procedure well.                                               Di Kindle. Edilia Bo, M.D.    CSD/MEDQ  D:  04/09/2002  T:  04/09/2002  Job:  [id number]

## 2010-09-09 NOTE — Discharge Summary (Signed)
NAMERAWN, QUIROA                ACCOUNT NO.:  [id number]   MEDICAL RECORD NO.:  [id number]          PATIENT TYPE:  OBV   LOCATION:  4707                         FACILITY:  [facility]   PHYSICIAN:  Doylene Canning. Ladona Ridgel, M.D.  DATE OF BIRTH:  1951-04-18   DATE OF ADMISSION:  10/12/2004  DATE OF DISCHARGE:  10/13/2004                           DISCHARGE SUMMARY - REFERRING   REFERRING PHYSICIAN:  Lorre Nick, M.D.   SUMMARY OF HISTORY:  Mr. Hallas is a 60 year old male who presented with a  nausea and funny feeling in his stomach.  He also complained of a lot of  gas.  After undergoing his dialysis, he developed epigastric and substernal  chest discomfort.  He describes three distinct episodes of discomfort  ranging from five to 10 minutes without radiation or nausea, vomiting or  diaphoresis.  Possible shortness of breath, thus his admission.   His history is notable for secondary hyperparathyroidism, anemia, peritoneal  dialysis, transplant nephropathy in 2004, hypertension.  PENICILLIN allergy.   LABORATORY DATA:  Enzymes and troponins were negative for myocardial  infarction.  BNP on admission was 277.3.  Admission sodium was 139,  potassium 4.0, BUN 34, creatinine 9.7.  AST and ALT were slightly elevated  at 58 and 72.  H&H was 13.5 and 40.4, normal indices, platelets 305, wbc  6.9.  PT 13.2, PTT 26.   Chest x-ray did not show any active disease.   EKG showed sinus tachycardia, normal axis, delayed R-wave, nonspecific STT  wave changes.   HOSPITAL COURSE:  Patient was admitted to [facility]  for observation overnight.  He did not have any further symptoms.  Enzymes  and EKGs were negative for myocardial infarction.  Dr.  Tenny Craw noted that he  had a thrombectomy yesterday on the day prior to admission with some  residual thrombus.  She discussed with Dr. Briant Cedar about possible  anticoagulation, however, he recommended no anticoagulation with small  thrombus and  pseudoaneurysm.  Since enzymes were negative, she discharged  the patient with outpatient treadmill arrangements.  Dr. Edilia Bo saw the  patient in consultation to evaluate the right arm fistula on October 13, 2004,  prior to his discharge.   DISCHARGE DIAGNOSIS:  Atypical chest discomfort of uncertain etiology.  History as previously.   DISPOSITION:  He is discharged home.  He is asked to maintain a renal diet.  Continue aspirin 325 daily, Nephro-Vite daily and Tums as previously.  Follow-up with Dr. Eliott Nine and continue his hemodialysis on Monday,  Wednesday, Friday.  He was advised no smoking or tobacco products.  He will  have an adenosine Myoview on October 18, 2004, at 7:45.  Was advised nothing to  eat or drink but may take his medications with a small sip of water.      Irving Burton   EW/MEDQ  D:  11/10/2004  T:  11/10/2004  Job:  [id number]   cc:   Lorre Nick, M.D.  [address] [place], [place] [address]  Fax: [phone]   [facility] Salvia. Eliott Nine, M.D.  [address]  [place]  [place]  [address]  Fax: [phone]

## 2011-01-31 LAB — POTASSIUM: Potassium: 5.1

## 2011-02-06 LAB — POCT I-STAT 4, (NA,K, GLUC, HGB,HCT)
Glucose, Bld: 91
Hemoglobin: 15
Operator id: 181601
Operator id: 181601
Potassium: 4.1
Sodium: 138

## 2011-03-22 ENCOUNTER — Encounter: Payer: Medicare Other | Attending: Nephrology | Admitting: Dietician

## 2011-03-22 DIAGNOSIS — E119 Type 2 diabetes mellitus without complications: Secondary | ICD-10-CM | POA: Insufficient documentation

## 2011-03-22 DIAGNOSIS — Z9689 Presence of other specified functional implants: Secondary | ICD-10-CM | POA: Insufficient documentation

## 2011-03-22 DIAGNOSIS — E785 Hyperlipidemia, unspecified: Secondary | ICD-10-CM | POA: Insufficient documentation

## 2011-03-22 DIAGNOSIS — I10 Essential (primary) hypertension: Secondary | ICD-10-CM | POA: Insufficient documentation

## 2011-03-22 NOTE — Patient Instructions (Addendum)
   Continue to check feet daily.  For any signs of skin breakdown or infection daily.  Get to your MD with the issue. Check for dryness.  If dry get a pumice stone ([employer]).  Use with damp/wet heels and wet stone, gently rub the area and remove the skin.  In the next few weeks, consider getting an eye examination.  See Dr. Talmage Nap.  Start to read food labels and restrict the carbohydrate.  Soda use the diet.  Use Spenda or Stevia for sweetening coffee, or other beverages.  Use the diet juice (Diet Ocean Spray)  On the food label, try to get 3 gm of fiber with the whole grain, breads and cereals, beans, and peas.  On the food label try to keep the sugar content at 0-9 gm.  Try to keep the carbohydrate intake to 15 gm plus a protein.  Have a protein at each meal and snack.  Plan to follow-up with Maggie in 8-12 weeks. Call [phone] for an appointment.

## 2011-03-22 NOTE — Progress Notes (Signed)
  Medical Nutrition Therapy:  Appt start time: 1130 end time:  1300.  Assessment:  Primary concerns today: Blood glucose control. History of kidney transplant X2.  Most recent on 08/15/2008.  With the use of anti-rejection medications, he has developed type 2 diabetes.  Verbalizes the desire to control his blood glucose using oral medications and to avoid insulin therapy.  On discussion of the use of insulin and the physiology of diabetes, he became more accepting of the role and use of insulin in controlling blood glucose.  He is retired and is working hard to maintain a healthy state and avoid the complications of Kidney disease and diabetes.  MEDICATIONS: On multiple medications that include:  Glipizide 10 mg BID, Actos 30 mg Daily, Prograf, 2 mg BID, Prednisone 5 mg daily.  DIETARY INTAKE:  24-hr recall:  B (9:00-10:00 AM): Eggs 1-2, strawberries or blue berries 1/2 cup,  Wheat toast 1 slice on occasion have grits, [place] bacon or rice.  Sometimes a grill cheese sandwich on ww bread and chicken soup ( chick Filet chicken noodle).  Coffee, usually water. Snk (mis- AM) :none  L (12-1:00 PM): Tuna fish with spinach on wheat bread sub roll, slice of tomato, (6 inch roll)  Snk (mid- PM): walnuts, cashews, almonds, apples, for snacking, grapefruit, or popcorn, or yogurt. D (6-8:00 PM):Has snacks of fruit, cukes, asparagus and may not have dinner as such. May skip the evening meal Snk (later PM): Yogurt with walnuts, banana or apple. Beverages: Water, cranberry juice or Sprite (regular) on occasion.  Recent physical activity: Gym/cardio 3 times per week with some resistance on occasion.  BLOOD GLUCOSE: Checking 2-4 times per week.  Fasting; 190, 260, 167, 160-170-190.  Estimated energy needs:67 in, 161.9 lb, IBW: 148 +/- 10%, Goal wt: 150 lb.  Adjusted wt:153 lb (70 kg) 1700-1800 calories 195-200 g carbohydrates 125-130 g protein 45-48 g fat  Progress Towards Goal(s):  In  progress.   Nutritional Diagnosis:  [place]-2.1 Inpaired nutrition utilization As related to glucose.  As evidenced by HgA1C 9.3% and elevated fasting blood glucose levels..    Intervention:  Nutrition Currently eating more in a grazing manner.  Skips meals as such.  Using regular juice and an occasional regular Sprite.  Is a label reader for sodium and is now to start to read his labels for carbohydrates, fiber and sugar.  Agrees to try to limit his sugar to 0-9 gm per serving of various food items.  Will look to eat more regular meals and monitor snacking.  Anxious to maintain a more normal pattern of eating and enjoy the opportunity to eat various fruits and vegetables.  Handouts given during visit include:  Living Well with Diabetes  Novo Nordisk Carbohydrate Counting Guide  Blood Glucose Self-management Guide for individualized goals.   Monitoring/Evaluation:  Dietary intake, exercise, blood glucose, and body weight 8-12 weeks and is to call for an appointment.

## 2011-03-24 ENCOUNTER — Encounter: Payer: Self-pay | Admitting: Dietician

## 2011-05-08 DIAGNOSIS — Z940 Kidney transplant status: Secondary | ICD-10-CM | POA: Diagnosis not present

## 2011-05-08 DIAGNOSIS — Z79899 Other long term (current) drug therapy: Secondary | ICD-10-CM | POA: Diagnosis not present

## 2011-05-09 DIAGNOSIS — Z79899 Other long term (current) drug therapy: Secondary | ICD-10-CM | POA: Diagnosis not present

## 2011-05-09 DIAGNOSIS — Z940 Kidney transplant status: Secondary | ICD-10-CM | POA: Diagnosis not present

## 2011-05-10 DIAGNOSIS — Z940 Kidney transplant status: Secondary | ICD-10-CM | POA: Diagnosis not present

## 2011-05-10 DIAGNOSIS — Z79899 Other long term (current) drug therapy: Secondary | ICD-10-CM | POA: Diagnosis not present

## 2011-05-10 DIAGNOSIS — N2581 Secondary hyperparathyroidism of renal origin: Secondary | ICD-10-CM | POA: Diagnosis not present

## 2011-05-10 DIAGNOSIS — E785 Hyperlipidemia, unspecified: Secondary | ICD-10-CM | POA: Diagnosis not present

## 2011-05-10 DIAGNOSIS — D72819 Decreased white blood cell count, unspecified: Secondary | ICD-10-CM | POA: Diagnosis not present

## 2011-05-10 DIAGNOSIS — E119 Type 2 diabetes mellitus without complications: Secondary | ICD-10-CM | POA: Diagnosis not present

## 2011-05-19 DIAGNOSIS — I10 Essential (primary) hypertension: Secondary | ICD-10-CM | POA: Diagnosis not present

## 2011-07-13 DIAGNOSIS — Z79899 Other long term (current) drug therapy: Secondary | ICD-10-CM | POA: Diagnosis not present

## 2011-07-13 DIAGNOSIS — Z940 Kidney transplant status: Secondary | ICD-10-CM | POA: Diagnosis not present

## 2011-07-18 DIAGNOSIS — D72819 Decreased white blood cell count, unspecified: Secondary | ICD-10-CM | POA: Diagnosis not present

## 2011-07-18 DIAGNOSIS — E119 Type 2 diabetes mellitus without complications: Secondary | ICD-10-CM | POA: Diagnosis not present

## 2011-07-18 DIAGNOSIS — Z79899 Other long term (current) drug therapy: Secondary | ICD-10-CM | POA: Diagnosis not present

## 2011-07-18 DIAGNOSIS — Z940 Kidney transplant status: Secondary | ICD-10-CM | POA: Diagnosis not present

## 2011-08-07 DIAGNOSIS — N401 Enlarged prostate with lower urinary tract symptoms: Secondary | ICD-10-CM | POA: Diagnosis not present

## 2011-08-30 DIAGNOSIS — E119 Type 2 diabetes mellitus without complications: Secondary | ICD-10-CM | POA: Diagnosis not present

## 2011-08-30 DIAGNOSIS — H5200 Hypermetropia, unspecified eye: Secondary | ICD-10-CM | POA: Diagnosis not present

## 2011-08-30 DIAGNOSIS — H524 Presbyopia: Secondary | ICD-10-CM | POA: Diagnosis not present

## 2011-08-30 DIAGNOSIS — H52229 Regular astigmatism, unspecified eye: Secondary | ICD-10-CM | POA: Diagnosis not present

## 2011-09-21 DIAGNOSIS — N2581 Secondary hyperparathyroidism of renal origin: Secondary | ICD-10-CM | POA: Diagnosis not present

## 2011-09-21 DIAGNOSIS — D72819 Decreased white blood cell count, unspecified: Secondary | ICD-10-CM | POA: Diagnosis not present

## 2011-09-21 DIAGNOSIS — E119 Type 2 diabetes mellitus without complications: Secondary | ICD-10-CM | POA: Diagnosis not present

## 2011-09-21 DIAGNOSIS — E785 Hyperlipidemia, unspecified: Secondary | ICD-10-CM | POA: Diagnosis not present

## 2011-09-21 DIAGNOSIS — Z940 Kidney transplant status: Secondary | ICD-10-CM | POA: Diagnosis not present

## 2011-09-21 DIAGNOSIS — Z79899 Other long term (current) drug therapy: Secondary | ICD-10-CM | POA: Diagnosis not present

## 2011-09-25 DIAGNOSIS — I10 Essential (primary) hypertension: Secondary | ICD-10-CM | POA: Diagnosis not present

## 2011-11-17 DIAGNOSIS — Z79899 Other long term (current) drug therapy: Secondary | ICD-10-CM | POA: Diagnosis not present

## 2011-11-17 DIAGNOSIS — Z940 Kidney transplant status: Secondary | ICD-10-CM | POA: Diagnosis not present

## 2011-11-17 DIAGNOSIS — D72819 Decreased white blood cell count, unspecified: Secondary | ICD-10-CM | POA: Diagnosis not present

## 2011-11-17 DIAGNOSIS — E119 Type 2 diabetes mellitus without complications: Secondary | ICD-10-CM | POA: Diagnosis not present

## 2011-11-20 DIAGNOSIS — Z79899 Other long term (current) drug therapy: Secondary | ICD-10-CM | POA: Diagnosis not present

## 2011-11-20 DIAGNOSIS — Z940 Kidney transplant status: Secondary | ICD-10-CM | POA: Diagnosis not present

## 2012-02-07 DIAGNOSIS — E785 Hyperlipidemia, unspecified: Secondary | ICD-10-CM | POA: Diagnosis not present

## 2012-02-07 DIAGNOSIS — Z79899 Other long term (current) drug therapy: Secondary | ICD-10-CM | POA: Diagnosis not present

## 2012-02-07 DIAGNOSIS — D72819 Decreased white blood cell count, unspecified: Secondary | ICD-10-CM | POA: Diagnosis not present

## 2012-02-07 DIAGNOSIS — N2581 Secondary hyperparathyroidism of renal origin: Secondary | ICD-10-CM | POA: Diagnosis not present

## 2012-02-07 DIAGNOSIS — Z940 Kidney transplant status: Secondary | ICD-10-CM | POA: Diagnosis not present

## 2012-02-07 DIAGNOSIS — E119 Type 2 diabetes mellitus without complications: Secondary | ICD-10-CM | POA: Diagnosis not present

## 2012-02-08 DIAGNOSIS — Z940 Kidney transplant status: Secondary | ICD-10-CM | POA: Diagnosis not present

## 2012-02-08 DIAGNOSIS — Z79899 Other long term (current) drug therapy: Secondary | ICD-10-CM | POA: Diagnosis not present

## 2012-03-15 DIAGNOSIS — I10 Essential (primary) hypertension: Secondary | ICD-10-CM | POA: Diagnosis not present

## 2012-04-03 DIAGNOSIS — Z940 Kidney transplant status: Secondary | ICD-10-CM | POA: Diagnosis not present

## 2012-04-03 DIAGNOSIS — E119 Type 2 diabetes mellitus without complications: Secondary | ICD-10-CM | POA: Diagnosis not present

## 2012-04-03 DIAGNOSIS — Z79899 Other long term (current) drug therapy: Secondary | ICD-10-CM | POA: Diagnosis not present

## 2012-04-03 DIAGNOSIS — D72819 Decreased white blood cell count, unspecified: Secondary | ICD-10-CM | POA: Diagnosis not present

## 2012-04-04 DIAGNOSIS — Z940 Kidney transplant status: Secondary | ICD-10-CM | POA: Diagnosis not present

## 2012-04-04 DIAGNOSIS — Z79899 Other long term (current) drug therapy: Secondary | ICD-10-CM | POA: Diagnosis not present

## 2012-04-22 DIAGNOSIS — Z79899 Other long term (current) drug therapy: Secondary | ICD-10-CM | POA: Diagnosis not present

## 2012-04-22 DIAGNOSIS — Z940 Kidney transplant status: Secondary | ICD-10-CM | POA: Diagnosis not present

## 2012-05-24 DIAGNOSIS — Z79899 Other long term (current) drug therapy: Secondary | ICD-10-CM | POA: Diagnosis not present

## 2012-05-24 DIAGNOSIS — I10 Essential (primary) hypertension: Secondary | ICD-10-CM | POA: Diagnosis not present

## 2012-05-24 DIAGNOSIS — D72819 Decreased white blood cell count, unspecified: Secondary | ICD-10-CM | POA: Diagnosis not present

## 2012-05-24 DIAGNOSIS — E119 Type 2 diabetes mellitus without complications: Secondary | ICD-10-CM | POA: Diagnosis not present

## 2012-05-24 DIAGNOSIS — Z940 Kidney transplant status: Secondary | ICD-10-CM | POA: Diagnosis not present

## 2012-05-27 DIAGNOSIS — Z940 Kidney transplant status: Secondary | ICD-10-CM | POA: Diagnosis not present

## 2012-05-27 DIAGNOSIS — Z79899 Other long term (current) drug therapy: Secondary | ICD-10-CM | POA: Diagnosis not present

## 2012-07-26 DIAGNOSIS — I10 Essential (primary) hypertension: Secondary | ICD-10-CM | POA: Diagnosis not present

## 2012-07-26 DIAGNOSIS — E109 Type 1 diabetes mellitus without complications: Secondary | ICD-10-CM | POA: Diagnosis not present

## 2012-07-31 DIAGNOSIS — E119 Type 2 diabetes mellitus without complications: Secondary | ICD-10-CM | POA: Diagnosis not present

## 2012-07-31 DIAGNOSIS — E785 Hyperlipidemia, unspecified: Secondary | ICD-10-CM | POA: Diagnosis not present

## 2012-07-31 DIAGNOSIS — Z940 Kidney transplant status: Secondary | ICD-10-CM | POA: Diagnosis not present

## 2012-07-31 DIAGNOSIS — D72819 Decreased white blood cell count, unspecified: Secondary | ICD-10-CM | POA: Diagnosis not present

## 2012-07-31 DIAGNOSIS — N2581 Secondary hyperparathyroidism of renal origin: Secondary | ICD-10-CM | POA: Diagnosis not present

## 2012-07-31 DIAGNOSIS — Z79899 Other long term (current) drug therapy: Secondary | ICD-10-CM | POA: Diagnosis not present

## 2012-08-01 DIAGNOSIS — Z940 Kidney transplant status: Secondary | ICD-10-CM | POA: Diagnosis not present

## 2012-09-23 DIAGNOSIS — D72819 Decreased white blood cell count, unspecified: Secondary | ICD-10-CM | POA: Diagnosis not present

## 2012-09-23 DIAGNOSIS — Z940 Kidney transplant status: Secondary | ICD-10-CM | POA: Diagnosis not present

## 2012-09-23 DIAGNOSIS — Z79899 Other long term (current) drug therapy: Secondary | ICD-10-CM | POA: Diagnosis not present

## 2012-09-23 DIAGNOSIS — E785 Hyperlipidemia, unspecified: Secondary | ICD-10-CM | POA: Diagnosis not present

## 2012-09-23 DIAGNOSIS — E119 Type 2 diabetes mellitus without complications: Secondary | ICD-10-CM | POA: Diagnosis not present

## 2012-09-24 DIAGNOSIS — R5383 Other fatigue: Secondary | ICD-10-CM | POA: Diagnosis not present

## 2012-09-24 DIAGNOSIS — R5381 Other malaise: Secondary | ICD-10-CM | POA: Diagnosis not present

## 2012-09-24 DIAGNOSIS — Z79899 Other long term (current) drug therapy: Secondary | ICD-10-CM | POA: Diagnosis not present

## 2012-09-24 DIAGNOSIS — Z940 Kidney transplant status: Secondary | ICD-10-CM | POA: Diagnosis not present

## 2012-10-10 DIAGNOSIS — E119 Type 2 diabetes mellitus without complications: Secondary | ICD-10-CM | POA: Diagnosis not present

## 2012-10-11 DIAGNOSIS — H40039 Anatomical narrow angle, unspecified eye: Secondary | ICD-10-CM | POA: Diagnosis not present

## 2012-11-15 DIAGNOSIS — H40039 Anatomical narrow angle, unspecified eye: Secondary | ICD-10-CM | POA: Diagnosis not present

## 2012-11-15 DIAGNOSIS — H2510 Age-related nuclear cataract, unspecified eye: Secondary | ICD-10-CM | POA: Diagnosis not present

## 2012-11-22 DIAGNOSIS — E119 Type 2 diabetes mellitus without complications: Secondary | ICD-10-CM | POA: Diagnosis not present

## 2012-11-22 DIAGNOSIS — D72819 Decreased white blood cell count, unspecified: Secondary | ICD-10-CM | POA: Diagnosis not present

## 2012-11-22 DIAGNOSIS — E785 Hyperlipidemia, unspecified: Secondary | ICD-10-CM | POA: Diagnosis not present

## 2012-11-22 DIAGNOSIS — Z79899 Other long term (current) drug therapy: Secondary | ICD-10-CM | POA: Diagnosis not present

## 2012-11-22 DIAGNOSIS — Z940 Kidney transplant status: Secondary | ICD-10-CM | POA: Diagnosis not present

## 2012-11-22 DIAGNOSIS — N2581 Secondary hyperparathyroidism of renal origin: Secondary | ICD-10-CM | POA: Diagnosis not present

## 2012-11-25 DIAGNOSIS — Z79899 Other long term (current) drug therapy: Secondary | ICD-10-CM | POA: Diagnosis not present

## 2012-11-25 DIAGNOSIS — Z940 Kidney transplant status: Secondary | ICD-10-CM | POA: Diagnosis not present

## 2012-12-19 DIAGNOSIS — H40039 Anatomical narrow angle, unspecified eye: Secondary | ICD-10-CM | POA: Diagnosis not present

## 2012-12-19 DIAGNOSIS — H52209 Unspecified astigmatism, unspecified eye: Secondary | ICD-10-CM | POA: Diagnosis not present

## 2012-12-19 DIAGNOSIS — H2510 Age-related nuclear cataract, unspecified eye: Secondary | ICD-10-CM | POA: Diagnosis not present

## 2013-01-29 DIAGNOSIS — E119 Type 2 diabetes mellitus without complications: Secondary | ICD-10-CM | POA: Diagnosis not present

## 2013-01-29 DIAGNOSIS — Z940 Kidney transplant status: Secondary | ICD-10-CM | POA: Diagnosis not present

## 2013-01-29 DIAGNOSIS — D72819 Decreased white blood cell count, unspecified: Secondary | ICD-10-CM | POA: Diagnosis not present

## 2013-01-29 DIAGNOSIS — E785 Hyperlipidemia, unspecified: Secondary | ICD-10-CM | POA: Diagnosis not present

## 2013-01-29 DIAGNOSIS — Z79899 Other long term (current) drug therapy: Secondary | ICD-10-CM | POA: Diagnosis not present

## 2013-01-30 DIAGNOSIS — Z940 Kidney transplant status: Secondary | ICD-10-CM | POA: Diagnosis not present

## 2013-01-30 DIAGNOSIS — Z23 Encounter for immunization: Secondary | ICD-10-CM | POA: Diagnosis not present

## 2013-01-30 DIAGNOSIS — Z79899 Other long term (current) drug therapy: Secondary | ICD-10-CM | POA: Diagnosis not present

## 2013-03-13 DIAGNOSIS — I10 Essential (primary) hypertension: Secondary | ICD-10-CM | POA: Diagnosis not present

## 2013-04-03 DIAGNOSIS — N2581 Secondary hyperparathyroidism of renal origin: Secondary | ICD-10-CM | POA: Diagnosis not present

## 2013-04-03 DIAGNOSIS — D72819 Decreased white blood cell count, unspecified: Secondary | ICD-10-CM | POA: Diagnosis not present

## 2013-04-03 DIAGNOSIS — Z79899 Other long term (current) drug therapy: Secondary | ICD-10-CM | POA: Diagnosis not present

## 2013-04-03 DIAGNOSIS — E119 Type 2 diabetes mellitus without complications: Secondary | ICD-10-CM | POA: Diagnosis not present

## 2013-04-03 DIAGNOSIS — Z940 Kidney transplant status: Secondary | ICD-10-CM | POA: Diagnosis not present

## 2013-04-03 DIAGNOSIS — E785 Hyperlipidemia, unspecified: Secondary | ICD-10-CM | POA: Diagnosis not present

## 2013-04-04 DIAGNOSIS — N2581 Secondary hyperparathyroidism of renal origin: Secondary | ICD-10-CM | POA: Diagnosis not present

## 2013-04-04 DIAGNOSIS — Z940 Kidney transplant status: Secondary | ICD-10-CM | POA: Diagnosis not present

## 2013-04-04 DIAGNOSIS — R809 Proteinuria, unspecified: Secondary | ICD-10-CM | POA: Diagnosis not present

## 2013-06-05 DIAGNOSIS — Z940 Kidney transplant status: Secondary | ICD-10-CM | POA: Diagnosis not present

## 2013-06-05 DIAGNOSIS — E119 Type 2 diabetes mellitus without complications: Secondary | ICD-10-CM | POA: Diagnosis not present

## 2013-06-05 DIAGNOSIS — E785 Hyperlipidemia, unspecified: Secondary | ICD-10-CM | POA: Diagnosis not present

## 2013-06-05 DIAGNOSIS — Z79899 Other long term (current) drug therapy: Secondary | ICD-10-CM | POA: Diagnosis not present

## 2013-06-06 DIAGNOSIS — R809 Proteinuria, unspecified: Secondary | ICD-10-CM | POA: Diagnosis not present

## 2013-06-06 DIAGNOSIS — N183 Chronic kidney disease, stage 3 unspecified: Secondary | ICD-10-CM | POA: Diagnosis not present

## 2013-06-06 DIAGNOSIS — Z940 Kidney transplant status: Secondary | ICD-10-CM | POA: Diagnosis not present

## 2013-06-06 DIAGNOSIS — N2581 Secondary hyperparathyroidism of renal origin: Secondary | ICD-10-CM | POA: Diagnosis not present

## 2013-08-26 DIAGNOSIS — Z940 Kidney transplant status: Secondary | ICD-10-CM | POA: Diagnosis not present

## 2013-08-26 DIAGNOSIS — Z79899 Other long term (current) drug therapy: Secondary | ICD-10-CM | POA: Diagnosis not present

## 2013-08-27 DIAGNOSIS — Z940 Kidney transplant status: Secondary | ICD-10-CM | POA: Diagnosis not present

## 2013-08-27 DIAGNOSIS — N183 Chronic kidney disease, stage 3 unspecified: Secondary | ICD-10-CM | POA: Diagnosis not present

## 2013-08-27 DIAGNOSIS — R809 Proteinuria, unspecified: Secondary | ICD-10-CM | POA: Diagnosis not present

## 2013-08-27 DIAGNOSIS — N2581 Secondary hyperparathyroidism of renal origin: Secondary | ICD-10-CM | POA: Diagnosis not present

## 2013-09-11 DIAGNOSIS — I10 Essential (primary) hypertension: Secondary | ICD-10-CM | POA: Diagnosis not present

## 2013-09-11 DIAGNOSIS — IMO0001 Reserved for inherently not codable concepts without codable children: Secondary | ICD-10-CM | POA: Diagnosis not present

## 2013-12-05 DIAGNOSIS — Z940 Kidney transplant status: Secondary | ICD-10-CM | POA: Diagnosis not present

## 2013-12-05 DIAGNOSIS — Z79899 Other long term (current) drug therapy: Secondary | ICD-10-CM | POA: Diagnosis not present

## 2013-12-05 DIAGNOSIS — E119 Type 2 diabetes mellitus without complications: Secondary | ICD-10-CM | POA: Diagnosis not present

## 2013-12-08 DIAGNOSIS — N2581 Secondary hyperparathyroidism of renal origin: Secondary | ICD-10-CM | POA: Diagnosis not present

## 2013-12-08 DIAGNOSIS — R809 Proteinuria, unspecified: Secondary | ICD-10-CM | POA: Diagnosis not present

## 2013-12-08 DIAGNOSIS — Z940 Kidney transplant status: Secondary | ICD-10-CM | POA: Diagnosis not present

## 2013-12-08 DIAGNOSIS — N183 Chronic kidney disease, stage 3 unspecified: Secondary | ICD-10-CM | POA: Diagnosis not present

## 2013-12-09 DIAGNOSIS — Z940 Kidney transplant status: Secondary | ICD-10-CM | POA: Diagnosis not present

## 2013-12-09 DIAGNOSIS — N2581 Secondary hyperparathyroidism of renal origin: Secondary | ICD-10-CM | POA: Diagnosis not present

## 2014-01-07 DIAGNOSIS — IMO0001 Reserved for inherently not codable concepts without codable children: Secondary | ICD-10-CM | POA: Diagnosis not present

## 2014-02-25 DIAGNOSIS — Z940 Kidney transplant status: Secondary | ICD-10-CM | POA: Diagnosis not present

## 2014-02-25 DIAGNOSIS — R809 Proteinuria, unspecified: Secondary | ICD-10-CM | POA: Diagnosis not present

## 2014-02-25 DIAGNOSIS — E119 Type 2 diabetes mellitus without complications: Secondary | ICD-10-CM | POA: Diagnosis not present

## 2014-02-25 DIAGNOSIS — N2581 Secondary hyperparathyroidism of renal origin: Secondary | ICD-10-CM | POA: Diagnosis not present

## 2014-02-25 DIAGNOSIS — N183 Chronic kidney disease, stage 3 (moderate): Secondary | ICD-10-CM | POA: Diagnosis not present

## 2014-02-25 DIAGNOSIS — Z79899 Other long term (current) drug therapy: Secondary | ICD-10-CM | POA: Diagnosis not present

## 2014-06-02 DIAGNOSIS — Z940 Kidney transplant status: Secondary | ICD-10-CM | POA: Diagnosis not present

## 2014-06-02 DIAGNOSIS — E119 Type 2 diabetes mellitus without complications: Secondary | ICD-10-CM | POA: Diagnosis not present

## 2014-06-04 DIAGNOSIS — N183 Chronic kidney disease, stage 3 (moderate): Secondary | ICD-10-CM | POA: Diagnosis not present

## 2014-06-04 DIAGNOSIS — R809 Proteinuria, unspecified: Secondary | ICD-10-CM | POA: Diagnosis not present

## 2014-06-04 DIAGNOSIS — Z940 Kidney transplant status: Secondary | ICD-10-CM | POA: Diagnosis not present

## 2014-06-04 DIAGNOSIS — N2581 Secondary hyperparathyroidism of renal origin: Secondary | ICD-10-CM | POA: Diagnosis not present

## 2014-08-12 DIAGNOSIS — E119 Type 2 diabetes mellitus without complications: Secondary | ICD-10-CM | POA: Diagnosis not present

## 2014-08-12 DIAGNOSIS — H2513 Age-related nuclear cataract, bilateral: Secondary | ICD-10-CM | POA: Diagnosis not present

## 2014-09-04 DIAGNOSIS — Z940 Kidney transplant status: Secondary | ICD-10-CM | POA: Diagnosis not present

## 2014-09-04 DIAGNOSIS — E119 Type 2 diabetes mellitus without complications: Secondary | ICD-10-CM | POA: Diagnosis not present

## 2014-09-04 DIAGNOSIS — R809 Proteinuria, unspecified: Secondary | ICD-10-CM | POA: Diagnosis not present

## 2014-09-04 DIAGNOSIS — N2581 Secondary hyperparathyroidism of renal origin: Secondary | ICD-10-CM | POA: Diagnosis not present

## 2014-09-07 DIAGNOSIS — E139 Other specified diabetes mellitus without complications: Secondary | ICD-10-CM | POA: Diagnosis not present

## 2014-09-07 DIAGNOSIS — R809 Proteinuria, unspecified: Secondary | ICD-10-CM | POA: Diagnosis not present

## 2014-09-07 DIAGNOSIS — N183 Chronic kidney disease, stage 3 (moderate): Secondary | ICD-10-CM | POA: Diagnosis not present

## 2014-09-07 DIAGNOSIS — Z940 Kidney transplant status: Secondary | ICD-10-CM | POA: Diagnosis not present

## 2014-09-09 DIAGNOSIS — H40033 Anatomical narrow angle, bilateral: Secondary | ICD-10-CM | POA: Diagnosis not present

## 2014-09-18 DIAGNOSIS — Z940 Kidney transplant status: Secondary | ICD-10-CM | POA: Diagnosis not present

## 2014-09-18 DIAGNOSIS — E1165 Type 2 diabetes mellitus with hyperglycemia: Secondary | ICD-10-CM | POA: Diagnosis not present

## 2014-09-18 DIAGNOSIS — I10 Essential (primary) hypertension: Secondary | ICD-10-CM | POA: Diagnosis not present

## 2014-12-14 DIAGNOSIS — Z940 Kidney transplant status: Secondary | ICD-10-CM | POA: Diagnosis not present

## 2014-12-14 DIAGNOSIS — E119 Type 2 diabetes mellitus without complications: Secondary | ICD-10-CM | POA: Diagnosis not present

## 2014-12-18 DIAGNOSIS — Z940 Kidney transplant status: Secondary | ICD-10-CM | POA: Diagnosis not present

## 2014-12-18 DIAGNOSIS — R809 Proteinuria, unspecified: Secondary | ICD-10-CM | POA: Diagnosis not present

## 2014-12-18 DIAGNOSIS — N183 Chronic kidney disease, stage 3 (moderate): Secondary | ICD-10-CM | POA: Diagnosis not present

## 2014-12-18 DIAGNOSIS — I10 Essential (primary) hypertension: Secondary | ICD-10-CM | POA: Diagnosis not present

## 2014-12-29 DIAGNOSIS — Z940 Kidney transplant status: Secondary | ICD-10-CM | POA: Diagnosis not present

## 2015-03-22 DIAGNOSIS — Z940 Kidney transplant status: Secondary | ICD-10-CM | POA: Diagnosis not present

## 2015-03-22 DIAGNOSIS — E119 Type 2 diabetes mellitus without complications: Secondary | ICD-10-CM | POA: Diagnosis not present

## 2015-03-22 DIAGNOSIS — N2581 Secondary hyperparathyroidism of renal origin: Secondary | ICD-10-CM | POA: Diagnosis not present

## 2015-03-22 DIAGNOSIS — R809 Proteinuria, unspecified: Secondary | ICD-10-CM | POA: Diagnosis not present

## 2015-03-23 DIAGNOSIS — E139 Other specified diabetes mellitus without complications: Secondary | ICD-10-CM | POA: Diagnosis not present

## 2015-03-23 DIAGNOSIS — N183 Chronic kidney disease, stage 3 (moderate): Secondary | ICD-10-CM | POA: Diagnosis not present

## 2015-03-23 DIAGNOSIS — R809 Proteinuria, unspecified: Secondary | ICD-10-CM | POA: Diagnosis not present

## 2015-03-23 DIAGNOSIS — Z940 Kidney transplant status: Secondary | ICD-10-CM | POA: Diagnosis not present

## 2015-03-23 DIAGNOSIS — I10 Essential (primary) hypertension: Secondary | ICD-10-CM | POA: Diagnosis not present

## 2015-03-23 DIAGNOSIS — N2581 Secondary hyperparathyroidism of renal origin: Secondary | ICD-10-CM | POA: Diagnosis not present

## 2015-03-26 DIAGNOSIS — E1165 Type 2 diabetes mellitus with hyperglycemia: Secondary | ICD-10-CM | POA: Diagnosis not present

## 2015-03-26 DIAGNOSIS — Z940 Kidney transplant status: Secondary | ICD-10-CM | POA: Diagnosis not present

## 2015-03-26 DIAGNOSIS — I10 Essential (primary) hypertension: Secondary | ICD-10-CM | POA: Diagnosis not present

## 2015-06-04 ENCOUNTER — Encounter: Payer: Self-pay | Admitting: Gastroenterology

## 2015-06-16 DIAGNOSIS — E139 Other specified diabetes mellitus without complications: Secondary | ICD-10-CM | POA: Diagnosis not present

## 2015-06-16 DIAGNOSIS — E213 Hyperparathyroidism, unspecified: Secondary | ICD-10-CM | POA: Diagnosis not present

## 2015-06-16 DIAGNOSIS — Z940 Kidney transplant status: Secondary | ICD-10-CM | POA: Diagnosis not present

## 2015-06-16 DIAGNOSIS — N2581 Secondary hyperparathyroidism of renal origin: Secondary | ICD-10-CM | POA: Diagnosis not present

## 2015-06-22 DIAGNOSIS — E139 Other specified diabetes mellitus without complications: Secondary | ICD-10-CM | POA: Diagnosis not present

## 2015-06-22 DIAGNOSIS — R809 Proteinuria, unspecified: Secondary | ICD-10-CM | POA: Diagnosis not present

## 2015-06-22 DIAGNOSIS — N183 Chronic kidney disease, stage 3 (moderate): Secondary | ICD-10-CM | POA: Diagnosis not present

## 2015-06-22 DIAGNOSIS — N2581 Secondary hyperparathyroidism of renal origin: Secondary | ICD-10-CM | POA: Diagnosis not present

## 2015-06-22 DIAGNOSIS — Z940 Kidney transplant status: Secondary | ICD-10-CM | POA: Diagnosis not present

## 2015-06-22 DIAGNOSIS — I10 Essential (primary) hypertension: Secondary | ICD-10-CM | POA: Diagnosis not present

## 2015-10-11 DIAGNOSIS — Z940 Kidney transplant status: Secondary | ICD-10-CM | POA: Diagnosis not present

## 2015-10-11 DIAGNOSIS — I10 Essential (primary) hypertension: Secondary | ICD-10-CM | POA: Diagnosis not present

## 2015-10-11 DIAGNOSIS — E1165 Type 2 diabetes mellitus with hyperglycemia: Secondary | ICD-10-CM | POA: Diagnosis not present

## 2015-10-27 DIAGNOSIS — Z940 Kidney transplant status: Secondary | ICD-10-CM | POA: Diagnosis not present

## 2015-10-27 DIAGNOSIS — E213 Hyperparathyroidism, unspecified: Secondary | ICD-10-CM | POA: Diagnosis not present

## 2015-10-27 DIAGNOSIS — E139 Other specified diabetes mellitus without complications: Secondary | ICD-10-CM | POA: Diagnosis not present

## 2015-11-04 DIAGNOSIS — N183 Chronic kidney disease, stage 3 (moderate): Secondary | ICD-10-CM | POA: Diagnosis not present

## 2015-11-04 DIAGNOSIS — E119 Type 2 diabetes mellitus without complications: Secondary | ICD-10-CM | POA: Diagnosis not present

## 2015-11-04 DIAGNOSIS — I10 Essential (primary) hypertension: Secondary | ICD-10-CM | POA: Diagnosis not present

## 2015-11-04 DIAGNOSIS — Z940 Kidney transplant status: Secondary | ICD-10-CM | POA: Diagnosis not present

## 2015-11-04 DIAGNOSIS — R809 Proteinuria, unspecified: Secondary | ICD-10-CM | POA: Diagnosis not present

## 2015-11-04 DIAGNOSIS — N2581 Secondary hyperparathyroidism of renal origin: Secondary | ICD-10-CM | POA: Diagnosis not present

## 2016-02-08 DIAGNOSIS — E119 Type 2 diabetes mellitus without complications: Secondary | ICD-10-CM | POA: Diagnosis not present

## 2016-02-08 DIAGNOSIS — Z940 Kidney transplant status: Secondary | ICD-10-CM | POA: Diagnosis not present

## 2016-02-08 DIAGNOSIS — N2581 Secondary hyperparathyroidism of renal origin: Secondary | ICD-10-CM | POA: Diagnosis not present

## 2016-02-10 DIAGNOSIS — E139 Other specified diabetes mellitus without complications: Secondary | ICD-10-CM | POA: Diagnosis not present

## 2016-02-10 DIAGNOSIS — R809 Proteinuria, unspecified: Secondary | ICD-10-CM | POA: Diagnosis not present

## 2016-02-10 DIAGNOSIS — N183 Chronic kidney disease, stage 3 (moderate): Secondary | ICD-10-CM | POA: Diagnosis not present

## 2016-02-10 DIAGNOSIS — N2581 Secondary hyperparathyroidism of renal origin: Secondary | ICD-10-CM | POA: Diagnosis not present

## 2016-02-10 DIAGNOSIS — Z940 Kidney transplant status: Secondary | ICD-10-CM | POA: Diagnosis not present

## 2016-02-10 DIAGNOSIS — I10 Essential (primary) hypertension: Secondary | ICD-10-CM | POA: Diagnosis not present

## 2016-02-15 DIAGNOSIS — Z940 Kidney transplant status: Secondary | ICD-10-CM | POA: Diagnosis not present

## 2016-05-01 DIAGNOSIS — E119 Type 2 diabetes mellitus without complications: Secondary | ICD-10-CM | POA: Diagnosis not present

## 2016-05-01 DIAGNOSIS — Z940 Kidney transplant status: Secondary | ICD-10-CM | POA: Diagnosis not present

## 2016-05-22 DIAGNOSIS — E139 Other specified diabetes mellitus without complications: Secondary | ICD-10-CM | POA: Diagnosis not present

## 2016-05-22 DIAGNOSIS — N183 Chronic kidney disease, stage 3 (moderate): Secondary | ICD-10-CM | POA: Diagnosis not present

## 2016-05-22 DIAGNOSIS — Z940 Kidney transplant status: Secondary | ICD-10-CM | POA: Diagnosis not present

## 2016-05-22 DIAGNOSIS — N2581 Secondary hyperparathyroidism of renal origin: Secondary | ICD-10-CM | POA: Diagnosis not present

## 2016-05-22 DIAGNOSIS — I10 Essential (primary) hypertension: Secondary | ICD-10-CM | POA: Diagnosis not present

## 2016-05-22 DIAGNOSIS — R809 Proteinuria, unspecified: Secondary | ICD-10-CM | POA: Diagnosis not present

## 2016-05-23 DIAGNOSIS — Z940 Kidney transplant status: Secondary | ICD-10-CM | POA: Diagnosis not present

## 2016-05-25 DIAGNOSIS — E1165 Type 2 diabetes mellitus with hyperglycemia: Secondary | ICD-10-CM | POA: Diagnosis not present

## 2016-05-25 DIAGNOSIS — I10 Essential (primary) hypertension: Secondary | ICD-10-CM | POA: Diagnosis not present

## 2016-05-25 DIAGNOSIS — Z940 Kidney transplant status: Secondary | ICD-10-CM | POA: Diagnosis not present

## 2016-06-01 DIAGNOSIS — Z79899 Other long term (current) drug therapy: Secondary | ICD-10-CM | POA: Diagnosis not present

## 2016-07-05 ENCOUNTER — Ambulatory Visit (AMBULATORY_SURGERY_CENTER): Payer: Self-pay | Admitting: *Deleted

## 2016-07-05 VITALS — Ht 67.0 in | Wt 171.0 lb

## 2016-07-05 DIAGNOSIS — Z1211 Encounter for screening for malignant neoplasm of colon: Secondary | ICD-10-CM

## 2016-07-05 MED ORDER — NA SULFATE-K SULFATE-MG SULF 17.5-3.13-1.6 GM/177ML PO SOLN: ORAL | 0 refills | Status: DC

## 2016-07-05 NOTE — Progress Notes (Signed)
Patient denies any allergies to eggs or soy. Patient denies any problems with anesthesia/sedation. Patient denies any oxygen use at home and does not take any diet/weight loss medications.  

## 2016-07-19 ENCOUNTER — Ambulatory Visit (AMBULATORY_SURGERY_CENTER): Payer: Medicare Other | Admitting: Gastroenterology

## 2016-07-19 ENCOUNTER — Encounter: Payer: Self-pay | Admitting: Gastroenterology

## 2016-07-19 VITALS — BP 132/71 | HR 76 | Temp 98.0°F | Resp 16 | Ht 62.0 in | Wt 171.0 lb

## 2016-07-19 DIAGNOSIS — K635 Polyp of colon: Secondary | ICD-10-CM

## 2016-07-19 DIAGNOSIS — Z1211 Encounter for screening for malignant neoplasm of colon: Secondary | ICD-10-CM

## 2016-07-19 DIAGNOSIS — D123 Benign neoplasm of transverse colon: Secondary | ICD-10-CM | POA: Diagnosis not present

## 2016-07-19 DIAGNOSIS — D122 Benign neoplasm of ascending colon: Secondary | ICD-10-CM

## 2016-07-19 DIAGNOSIS — Z1212 Encounter for screening for malignant neoplasm of rectum: Secondary | ICD-10-CM

## 2016-07-19 MED ORDER — SODIUM CHLORIDE 0.9 % IV SOLN: 500.0000 mL | INTRAVENOUS | Status: DC

## 2016-07-19 NOTE — Progress Notes (Signed)
A and O x3. Report to RN. Tolerated MAC anesthesia well.

## 2016-07-19 NOTE — Op Note (Signed)
[facility] Patient Name: Edwin Huang Procedure Date: 07/19/2016 9:50 AM MRN: [id number] Endoscopist: [place]. Loletha Carrow , MD Age: 66 Referring MD:  Date of Birth: Jan 30, 1951 Gender: Male Account #: [id number] Procedure:                Colonoscopy Indications:              Screening for colorectal malignant neoplasm (no                            polyps on 05/2005 colonoscopy) Medicines:                Monitored Anesthesia Care Procedure:                Pre-Anesthesia Assessment:                           - Prior to the procedure, a History and Physical                            was performed, and patient medications and                            allergies were reviewed. The patient's tolerance of                            previous anesthesia was also reviewed. The risks                            and benefits of the procedure and the sedation                            options and risks were discussed with the patient.                            All questions were answered, and informed consent                            was obtained. Anticoagulants: The patient has taken                            aspirin. It was decided not to withhold this                            medication prior to the procedure. ASA Grade                            Assessment: III - A patient with severe systemic                            disease. After reviewing the risks and benefits,                            the patient was deemed in satisfactory condition to  undergo the procedure.                           After obtaining informed consent, the colonoscope                            was passed under direct vision. Throughout the                            procedure, the patient's blood pressure, pulse, and                            oxygen saturations were monitored continuously. The                            Colonoscope was introduced through the anus and                   advanced to the the cecum, identified by                            appendiceal orifice and ileocecal valve. The                            colonoscopy was performed without difficulty. The                            patient tolerated the procedure well. The quality                            of the bowel preparation was good. The ileocecal                            valve, appendiceal orifice, and rectum were                            photographed. The quality of the bowel preparation                            was evaluated using the BBPS [facility] Bowel                            Preparation Scale) with scores of: Right Colon = 2,                            Transverse Colon = 2 and Left Colon = 2. The total                            BBPS score equals 6 after lavage. The bowel                            preparation used was SUPREP. Scope In: 10:04:25 AM Scope Out: 10:19:13 AM Scope Withdrawal Time: 0 hours 11 minutes 53 seconds  Total Procedure Duration: 0 hours 14 minutes 48 seconds  Findings:  The perianal and digital rectal examinations were                            normal.                           A 1 mm polyp was found in the proximal ascending                            colon. The polyp was sessile. The polyp was removed                            with a cold biopsy forceps. Resection and retrieval                            were complete.                           A 4 mm polyp was found in the hepatic flexure. The                            polyp was sessile. The polyp was removed with a                            cold snare. Resection and retrieval were complete.                           The exam was otherwise without abnormality on                            direct and retroflexion views. Complications:            No immediate complications. Estimated Blood Loss:     Estimated blood loss: none. Impression:               - One 1 mm polyp in  the proximal ascending colon,                            removed with a cold biopsy forceps. Resected and                            retrieved.                           - One 4 mm polyp at the hepatic flexure, removed                            with a cold snare. Resected and retrieved.                           - The examination was otherwise normal on direct                            and retroflexion views. Recommendation:           -  Patient has a contact number available for                            emergencies. The signs and symptoms of potential                            delayed complications were discussed with the                            patient. Return to normal activities tomorrow.                            Written discharge instructions were provided to the                            patient.                           - Resume previous diet.                           - Continue present medications.                           - Await pathology results.                           - Repeat colonoscopy is recommended for                            surveillance. The colonoscopy date will be                            determined after pathology results from today's                            exam become available for review. Godwin Tedesco L. Loletha Carrow, MD 07/19/2016 10:25:53 AM This report has been signed electronically.

## 2016-07-19 NOTE — Progress Notes (Signed)
Called to room to assist during endoscopic procedure.  Patient ID and intended procedure confirmed with present staff. Received instructions for my participation in the procedure from the performing physician.  

## 2016-07-19 NOTE — Patient Instructions (Signed)
Handout given on polyps  YOU HAD AN ENDOSCOPIC PROCEDURE TODAY: Refer to the procedure report and other information in the discharge instructions given to you for any specific questions about what was found during the examination. If this information does not answer your questions, please call [place] office at [phone] to clarify.   YOU SHOULD EXPECT: Some feelings of bloating in the abdomen. Passage of more gas than usual. Walking can help get rid of the air that was put into your GI tract during the procedure and reduce the bloating. If you had a lower endoscopy (such as a colonoscopy or flexible sigmoidoscopy) you may notice spotting of blood in your stool or on the toilet paper. Some abdominal soreness may be present for a day or two, also.  DIET: Your first meal following the procedure should be a light meal and then it is ok to progress to your normal diet. A half-sandwich or bowl of soup is an example of a good first meal. Heavy or fried foods are harder to digest and may make you feel nauseous or bloated. Drink plenty of fluids but you should avoid alcoholic beverages for 24 hours. If you had a esophageal dilation, please see attached instructions for diet.    ACTIVITY: Your care partner should take you home directly after the procedure. You should plan to take it easy, moving slowly for the rest of the day. You can resume normal activity the day after the procedure however YOU SHOULD NOT DRIVE, use power tools, machinery or perform tasks that involve climbing or major physical exertion for 24 hours (because of the sedation medicines used during the test).   SYMPTOMS TO REPORT IMMEDIATELY: A gastroenterologist can be reached at any hour. Please call [phone]  for any of the following symptoms:  Following lower endoscopy (colonoscopy, flexible sigmoidoscopy) Excessive amounts of blood in the stool  Significant tenderness, worsening of abdominal pains  Swelling of the abdomen that is  new, acute  Fever of 100 or higher    FOLLOW UP:  If any biopsies were taken you will be contacted by phone or by letter within the next 1-3 weeks. Call [phone]  if you have not heard about the biopsies in 3 weeks.  Please also call with any specific questions about appointments or follow up tests.  

## 2016-07-19 NOTE — Progress Notes (Signed)
Pt's states no medical or surgical changes since previsit or office visit. 

## 2016-07-20 ENCOUNTER — Telehealth: Payer: Self-pay | Admitting: *Deleted

## 2016-07-20 NOTE — Telephone Encounter (Signed)
  Follow up Call-  Call back number 07/19/2016  Post procedure Call Back phone  # [phone]  Permission to leave phone message Yes  Some recent data might be hidden     Patient questions:  Do you have a fever, pain , or abdominal swelling? No. Pain Score  0 *  Have you tolerated food without any problems? Yes.    Have you been able to return to your normal activities? Yes.    Do you have any questions about your discharge instructions: Diet   No. Medications  No. Follow up visit  No.  Do you have questions or concerns about your Care? No.  Actions: * If pain score is 4 or above: No action needed, pain <4.

## 2016-07-20 NOTE — Telephone Encounter (Signed)
  Follow up Call-  Call back number 07/19/2016  Post procedure Call Back phone  # [phone]  Permission to leave phone message Yes  Some recent data might be hidden    [facility]

## 2016-07-31 ENCOUNTER — Encounter: Payer: Self-pay | Admitting: Gastroenterology

## 2016-08-11 DIAGNOSIS — Z940 Kidney transplant status: Secondary | ICD-10-CM | POA: Diagnosis not present

## 2016-08-11 DIAGNOSIS — E119 Type 2 diabetes mellitus without complications: Secondary | ICD-10-CM | POA: Diagnosis not present

## 2016-08-11 DIAGNOSIS — N2581 Secondary hyperparathyroidism of renal origin: Secondary | ICD-10-CM | POA: Diagnosis not present

## 2016-08-15 DIAGNOSIS — I10 Essential (primary) hypertension: Secondary | ICD-10-CM | POA: Diagnosis not present

## 2016-08-15 DIAGNOSIS — Z940 Kidney transplant status: Secondary | ICD-10-CM | POA: Diagnosis not present

## 2016-08-15 DIAGNOSIS — N183 Chronic kidney disease, stage 3 (moderate): Secondary | ICD-10-CM | POA: Diagnosis not present

## 2016-08-15 DIAGNOSIS — R809 Proteinuria, unspecified: Secondary | ICD-10-CM | POA: Diagnosis not present

## 2016-08-15 DIAGNOSIS — E139 Other specified diabetes mellitus without complications: Secondary | ICD-10-CM | POA: Diagnosis not present

## 2016-08-15 DIAGNOSIS — N2581 Secondary hyperparathyroidism of renal origin: Secondary | ICD-10-CM | POA: Diagnosis not present

## 2016-08-22 DIAGNOSIS — R809 Proteinuria, unspecified: Secondary | ICD-10-CM | POA: Diagnosis not present

## 2016-11-13 DIAGNOSIS — Z940 Kidney transplant status: Secondary | ICD-10-CM | POA: Diagnosis not present

## 2016-11-13 DIAGNOSIS — E119 Type 2 diabetes mellitus without complications: Secondary | ICD-10-CM | POA: Diagnosis not present

## 2016-11-15 DIAGNOSIS — I10 Essential (primary) hypertension: Secondary | ICD-10-CM | POA: Diagnosis not present

## 2016-11-15 DIAGNOSIS — Z940 Kidney transplant status: Secondary | ICD-10-CM | POA: Diagnosis not present

## 2016-11-15 DIAGNOSIS — N2581 Secondary hyperparathyroidism of renal origin: Secondary | ICD-10-CM | POA: Diagnosis not present

## 2016-11-15 DIAGNOSIS — N183 Chronic kidney disease, stage 3 (moderate): Secondary | ICD-10-CM | POA: Diagnosis not present

## 2016-11-15 DIAGNOSIS — R809 Proteinuria, unspecified: Secondary | ICD-10-CM | POA: Diagnosis not present

## 2016-11-15 DIAGNOSIS — E139 Other specified diabetes mellitus without complications: Secondary | ICD-10-CM | POA: Diagnosis not present

## 2016-11-20 DIAGNOSIS — N183 Chronic kidney disease, stage 3 (moderate): Secondary | ICD-10-CM | POA: Diagnosis not present

## 2016-11-22 DIAGNOSIS — E1165 Type 2 diabetes mellitus with hyperglycemia: Secondary | ICD-10-CM | POA: Diagnosis not present

## 2016-11-22 DIAGNOSIS — Z940 Kidney transplant status: Secondary | ICD-10-CM | POA: Diagnosis not present

## 2016-11-22 DIAGNOSIS — I10 Essential (primary) hypertension: Secondary | ICD-10-CM | POA: Diagnosis not present

## 2016-11-30 DIAGNOSIS — Z940 Kidney transplant status: Secondary | ICD-10-CM | POA: Diagnosis not present

## 2017-03-12 DIAGNOSIS — N2581 Secondary hyperparathyroidism of renal origin: Secondary | ICD-10-CM | POA: Diagnosis not present

## 2017-03-12 DIAGNOSIS — Z940 Kidney transplant status: Secondary | ICD-10-CM | POA: Diagnosis not present

## 2017-03-12 DIAGNOSIS — E119 Type 2 diabetes mellitus without complications: Secondary | ICD-10-CM | POA: Diagnosis not present

## 2017-03-13 DIAGNOSIS — Z940 Kidney transplant status: Secondary | ICD-10-CM | POA: Diagnosis not present

## 2017-03-13 DIAGNOSIS — I129 Hypertensive chronic kidney disease with stage 1 through stage 4 chronic kidney disease, or unspecified chronic kidney disease: Secondary | ICD-10-CM | POA: Diagnosis not present

## 2017-03-13 DIAGNOSIS — E139 Other specified diabetes mellitus without complications: Secondary | ICD-10-CM | POA: Diagnosis not present

## 2017-03-13 DIAGNOSIS — R809 Proteinuria, unspecified: Secondary | ICD-10-CM | POA: Diagnosis not present

## 2017-03-13 DIAGNOSIS — N2581 Secondary hyperparathyroidism of renal origin: Secondary | ICD-10-CM | POA: Diagnosis not present

## 2017-03-13 DIAGNOSIS — N183 Chronic kidney disease, stage 3 (moderate): Secondary | ICD-10-CM | POA: Diagnosis not present

## 2017-06-08 DIAGNOSIS — Z940 Kidney transplant status: Secondary | ICD-10-CM | POA: Diagnosis not present

## 2017-06-12 DIAGNOSIS — E139 Other specified diabetes mellitus without complications: Secondary | ICD-10-CM | POA: Diagnosis not present

## 2017-06-12 DIAGNOSIS — Z940 Kidney transplant status: Secondary | ICD-10-CM | POA: Diagnosis not present

## 2017-06-12 DIAGNOSIS — N183 Chronic kidney disease, stage 3 (moderate): Secondary | ICD-10-CM | POA: Diagnosis not present

## 2017-06-12 DIAGNOSIS — R809 Proteinuria, unspecified: Secondary | ICD-10-CM | POA: Diagnosis not present

## 2017-06-12 DIAGNOSIS — N2581 Secondary hyperparathyroidism of renal origin: Secondary | ICD-10-CM | POA: Diagnosis not present

## 2017-06-18 DIAGNOSIS — I10 Essential (primary) hypertension: Secondary | ICD-10-CM | POA: Diagnosis not present

## 2017-06-18 DIAGNOSIS — E1165 Type 2 diabetes mellitus with hyperglycemia: Secondary | ICD-10-CM | POA: Diagnosis not present

## 2017-06-18 DIAGNOSIS — Z940 Kidney transplant status: Secondary | ICD-10-CM | POA: Diagnosis not present

## 2017-11-22 DIAGNOSIS — E119 Type 2 diabetes mellitus without complications: Secondary | ICD-10-CM | POA: Diagnosis not present

## 2017-11-22 DIAGNOSIS — Z940 Kidney transplant status: Secondary | ICD-10-CM | POA: Diagnosis not present

## 2017-11-27 DIAGNOSIS — I313 Pericardial effusion (noninflammatory): Secondary | ICD-10-CM | POA: Diagnosis not present

## 2017-11-27 DIAGNOSIS — Z940 Kidney transplant status: Secondary | ICD-10-CM | POA: Diagnosis not present

## 2017-11-27 DIAGNOSIS — I10 Essential (primary) hypertension: Secondary | ICD-10-CM | POA: Diagnosis not present

## 2017-11-27 DIAGNOSIS — E119 Type 2 diabetes mellitus without complications: Secondary | ICD-10-CM | POA: Diagnosis not present

## 2017-11-27 DIAGNOSIS — N2581 Secondary hyperparathyroidism of renal origin: Secondary | ICD-10-CM | POA: Diagnosis not present

## 2017-11-27 DIAGNOSIS — D899 Disorder involving the immune mechanism, unspecified: Secondary | ICD-10-CM | POA: Diagnosis not present

## 2017-11-27 DIAGNOSIS — E785 Hyperlipidemia, unspecified: Secondary | ICD-10-CM | POA: Diagnosis not present

## 2018-02-06 DIAGNOSIS — I10 Essential (primary) hypertension: Secondary | ICD-10-CM | POA: Diagnosis not present

## 2018-02-06 DIAGNOSIS — Z940 Kidney transplant status: Secondary | ICD-10-CM | POA: Diagnosis not present

## 2018-02-06 DIAGNOSIS — E1165 Type 2 diabetes mellitus with hyperglycemia: Secondary | ICD-10-CM | POA: Diagnosis not present

## 2018-03-15 DIAGNOSIS — Z940 Kidney transplant status: Secondary | ICD-10-CM | POA: Diagnosis not present

## 2018-03-15 DIAGNOSIS — E139 Other specified diabetes mellitus without complications: Secondary | ICD-10-CM | POA: Diagnosis not present

## 2018-03-27 DIAGNOSIS — I313 Pericardial effusion (noninflammatory): Secondary | ICD-10-CM | POA: Diagnosis not present

## 2018-03-27 DIAGNOSIS — N2581 Secondary hyperparathyroidism of renal origin: Secondary | ICD-10-CM | POA: Diagnosis not present

## 2018-03-27 DIAGNOSIS — E785 Hyperlipidemia, unspecified: Secondary | ICD-10-CM | POA: Diagnosis not present

## 2018-03-27 DIAGNOSIS — D899 Disorder involving the immune mechanism, unspecified: Secondary | ICD-10-CM | POA: Diagnosis not present

## 2018-03-27 DIAGNOSIS — Z940 Kidney transplant status: Secondary | ICD-10-CM | POA: Diagnosis not present

## 2018-03-27 DIAGNOSIS — I10 Essential (primary) hypertension: Secondary | ICD-10-CM | POA: Diagnosis not present

## 2018-03-27 DIAGNOSIS — E119 Type 2 diabetes mellitus without complications: Secondary | ICD-10-CM | POA: Diagnosis not present

## 2018-06-03 DIAGNOSIS — Z940 Kidney transplant status: Secondary | ICD-10-CM | POA: Diagnosis not present

## 2018-06-03 DIAGNOSIS — E139 Other specified diabetes mellitus without complications: Secondary | ICD-10-CM | POA: Diagnosis not present

## 2018-07-15 DIAGNOSIS — E1165 Type 2 diabetes mellitus with hyperglycemia: Secondary | ICD-10-CM | POA: Insufficient documentation

## 2018-07-15 DIAGNOSIS — Z7689 Persons encountering health services in other specified circumstances: Secondary | ICD-10-CM | POA: Insufficient documentation

## 2018-07-15 DIAGNOSIS — I152 Hypertension secondary to endocrine disorders: Secondary | ICD-10-CM | POA: Insufficient documentation

## 2018-07-15 DIAGNOSIS — Z940 Kidney transplant status: Secondary | ICD-10-CM | POA: Insufficient documentation

## 2018-07-15 DIAGNOSIS — E1159 Type 2 diabetes mellitus with other circulatory complications: Secondary | ICD-10-CM | POA: Insufficient documentation

## 2018-10-02 DIAGNOSIS — N400 Enlarged prostate without lower urinary tract symptoms: Secondary | ICD-10-CM | POA: Insufficient documentation

## 2019-02-03 DIAGNOSIS — Z0000 Encounter for general adult medical examination without abnormal findings: Secondary | ICD-10-CM | POA: Insufficient documentation

## 2019-12-08 DIAGNOSIS — E1169 Type 2 diabetes mellitus with other specified complication: Secondary | ICD-10-CM | POA: Insufficient documentation

## 2019-12-09 DIAGNOSIS — R5382 Chronic fatigue, unspecified: Secondary | ICD-10-CM | POA: Insufficient documentation

## 2020-06-02 DIAGNOSIS — D84821 Immunodeficiency due to drugs: Secondary | ICD-10-CM | POA: Insufficient documentation

## 2021-04-07 DIAGNOSIS — G8929 Other chronic pain: Secondary | ICD-10-CM | POA: Insufficient documentation

## 2022-01-31 DIAGNOSIS — K219 Gastro-esophageal reflux disease without esophagitis: Secondary | ICD-10-CM | POA: Insufficient documentation

## 2023-07-05 DIAGNOSIS — Z789 Other specified health status: Secondary | ICD-10-CM | POA: Insufficient documentation

## 2023-07-05 DIAGNOSIS — Z794 Long term (current) use of insulin: Secondary | ICD-10-CM | POA: Insufficient documentation

## 2023-07-06 ENCOUNTER — Encounter: Payer: Self-pay | Admitting: Gastroenterology

## 2023-08-16 ENCOUNTER — Ambulatory Visit (AMBULATORY_SURGERY_CENTER)

## 2023-08-16 VITALS — Ht 67.0 in | Wt 165.0 lb

## 2023-08-16 DIAGNOSIS — Z860100 Personal history of colon polyps, unspecified: Secondary | ICD-10-CM

## 2023-08-16 MED ORDER — PEG 3350-KCL-NA BICARB-NACL 420 G PO SOLR: 4000.0000 mL | Freq: Once | ORAL | 0 refills | Status: AC

## 2023-08-16 NOTE — Progress Notes (Signed)
 Pt's name and DOB verified at the beginning of the pre-visit wit 2 identifiers  Pt denies any difficulty with ambulating,sitting, laying down or rolling side to side  Pt has no issues with ambulation   Pt has no issues moving head neck or swallowing  No egg or soy allergy known to patient   No issues known to pt with past sedation with any surgeries or procedures  Pt denies having issues being intubated  No FH of Malignant Hyperthermia  Pt is not on diet pills or shots  Pt is not on home 02   Pt is not on blood thinners   Pt denies issues with constipation   Pt is not on dialysis  Pt denise any abnormal heart rhythms   Pt denies any upcoming cardiac testing  Chart not reviewed by CRNA prior to PV  Visit by phone  Pt states weight is 165 lb   IInstructions reviewed. Pt given , [facility] main # and MD on call # prior to instructions.  Pt states understanding of instructions. Instructed to review again prior to procedure. Pt states they will.

## 2023-08-17 ENCOUNTER — Encounter: Payer: Self-pay | Admitting: Gastroenterology

## 2023-08-17 ENCOUNTER — Encounter

## 2023-08-23 ENCOUNTER — Telehealth: Payer: Self-pay | Admitting: Gastroenterology

## 2023-08-23 NOTE — Telephone Encounter (Signed)
 Called and spoke with patient - extensive conversation concerning his prep - it appears the patient did not receive his written instructions for his prep= RN went over all of instructions with patient and patient wrote them down- patient verbalized understanding of information after RN had gone over information with him; patient re-taught instructions to RN for patient understanding;  patient advised to call back to the office if he had further questions;

## 2023-08-23 NOTE — Telephone Encounter (Signed)
 Patient called and stated that he would like to speak to the nurse to make sure that he is understanding his prep instruction correctly. Patient is schedule for a colonoscopy for May the 2 nd. Patient is requesting a call back. Please advise.

## 2023-08-24 ENCOUNTER — Encounter: Payer: Self-pay | Admitting: Gastroenterology

## 2023-08-24 ENCOUNTER — Ambulatory Visit (AMBULATORY_SURGERY_CENTER): Admitting: Gastroenterology

## 2023-08-24 VITALS — BP 122/69 | HR 75 | Resp 21 | Ht 67.0 in | Wt 165.0 lb

## 2023-08-24 DIAGNOSIS — Z1211 Encounter for screening for malignant neoplasm of colon: Secondary | ICD-10-CM | POA: Diagnosis not present

## 2023-08-24 DIAGNOSIS — Z860101 Personal history of adenomatous and serrated colon polyps: Secondary | ICD-10-CM

## 2023-08-24 DIAGNOSIS — K648 Other hemorrhoids: Secondary | ICD-10-CM

## 2023-08-24 DIAGNOSIS — Z860100 Personal history of colon polyps, unspecified: Secondary | ICD-10-CM

## 2023-08-24 MED ORDER — SODIUM CHLORIDE 0.9 % IV SOLN: 500.0000 mL | Freq: Once | INTRAVENOUS | Status: AC

## 2023-08-24 NOTE — Progress Notes (Signed)
 Pt's states no medical or surgical changes since previsit or office visit.

## 2023-08-24 NOTE — Op Note (Signed)
 [facility] Patient Name: Edwin Huang Procedure Date: 08/24/2023 3:04 PM MRN: [id number] Endoscopist: Ace Abu L. Dominic Friendly , MD, [id number] Age: 73 Referring MD:  Date of Birth: 1951/01/03 Gender: Male Account #: [id number] Procedure:                Colonoscopy Indications:              Surveillance: Personal history of adenomatous                            polyps on last colonoscopy > 5 years ago                           Diminutive tubular adenoma March 2018 no polyps 2007 Medicines:                Monitored Anesthesia Care Procedure:                Pre-Anesthesia Assessment:                           - Prior to the procedure, a History and Physical                            was performed, and patient medications and                            allergies were reviewed. The patient's tolerance of                            previous anesthesia was also reviewed. The risks                            and benefits of the procedure and the sedation                            options and risks were discussed with the patient.                            All questions were answered, and informed consent                            was obtained. Prior Anticoagulants: The patient has                            taken no anticoagulant or antiplatelet agents. ASA                            Grade Assessment: II - A patient with mild systemic                            disease. After reviewing the risks and benefits,                            the patient was deemed in satisfactory condition to  undergo the procedure.                           After obtaining informed consent, the colonoscope                            was passed under direct vision. Throughout the                            procedure, the patient's blood pressure, pulse, and                            oxygen saturations were monitored continuously. The                            Olympus Scope SN:  [id number] was introduced through                            the anus and advanced to the the cecum, identified                            by appendiceal orifice and ileocecal valve. The                            colonoscopy was performed without difficulty. The                            patient tolerated the procedure well. The quality                            of the bowel preparation was good after lavage of                            some scattered fibrous debris. The ileocecal valve,                            appendiceal orifice, and rectum were photographed. Scope In: 3:10:30 PM Scope Out: 3:24:07 PM Scope Withdrawal Time: 0 hours 10 minutes 44 seconds  Total Procedure Duration: 0 hours 13 minutes 37 seconds  Findings:                 The perianal and digital rectal examinations were                            normal.                           Repeat examination of right colon under NBI                            performed.                           Internal hemorrhoids were found during retroflexion.  The exam was otherwise without abnormality on                            direct and retroflexion views. Complications:            No immediate complications. Estimated Blood Loss:     Estimated blood loss: none. Impression:               - Internal hemorrhoids.                           - The examination was otherwise normal on direct                            and retroflexion views.                           - No specimens collected.                           - No polyps seen Recommendation:           - Patient has a contact number available for                            emergencies. The signs and symptoms of potential                            delayed complications were discussed with the                            patient. Return to normal activities tomorrow.                            Written discharge instructions were provided to the                             patient.                           - Resume previous diet.                           - Continue present medications.                           - No repeat screening/surveillance colonoscopy due                            to age, current guidelines and low risk findings                            today. Kemauri Musa L. Dominic Friendly, MD 08/24/2023 3:28:12 PM This report has been signed electronically.

## 2023-08-24 NOTE — Progress Notes (Signed)
 History and Physical:  This patient presents for endoscopic testing for: Encounter Diagnosis  Name Primary?   History of colonic polyps Yes    Surveillance colonoscopy today Diminutive tubular adenoma March 2018.  No polyps 2007 Patient denies chronic abdominal pain, rectal bleeding, constipation or diarrhea.   Patient is otherwise without complaints or active issues today.   Past Medical History: Past Medical History:  Diagnosis Date   Blood transfusion without reported diagnosis    Cataract    Diabetes mellitus without complication (HCC)    History of kidney transplant 2009   Hypertension      Past Surgical History: Past Surgical History:  Procedure Laterality Date   COLONOSCOPY     KIDNEY TRANSPLANT  08/16/2007    Allergies: Allergies  Allergen Reactions   Other Other (See Comments)    Contrast Dye- Unknown reaction   Penicillins Hives    Outpatient Meds: Current Outpatient Medications  Medication Sig Dispense Refill   ACCU-CHEK GUIDE TEST test strip SMARTSIG:Strip(s)     amLODipine (NORVASC) 5 MG tablet Take 10 mg by mouth daily.     aspirin EC 81 MG tablet Take 1 tablet by mouth daily.     Blood Glucose Monitoring Suppl (ACCU-CHEK GUIDE) w/Device KIT Use as directed by provider to monitor blood sugars 3 times daily     Blood Glucose Monitoring Suppl (GLUCOCOM BLOOD GLUCOSE MONITOR) DEVI Use as directed by provider to monitor blood sugars 3 times daily     calcitRIOL (ROCALTROL) 0.25 MCG capsule Take 1 capsule by mouth daily.     cinacalcet (SENSIPAR) 60 MG tablet Take 60 mg by mouth daily.     famotidine (PEPCID) 10 MG tablet Take 1 tablet by mouth daily.     insulin lispro protamine-lispro (HUMALOG 75/25 MIX) (75-25) 100 UNIT/ML SUSP injection Inject 40 Units into the skin 2 (two) times daily with a meal.     Insulin Pen Needle (BD PEN NEEDLE NANO U/F) 32G X 4 MM MISC 1 (one) Pre-filled Pen Syringe twice a day     lisinopril (PRINIVIL,ZESTRIL) 20 MG tablet  Take 40 mg by mouth daily.     Magnesium Oxide 500 MG TABS Take 3 tablets by mouth 3 (three) times daily.     mycophenolate (MYFORTIC) 180 MG EC tablet Take 3 tablets by mouth 2 (two) times daily.     predniSONE  (DELTASONE ) 5 MG tablet Take 5 mg by mouth daily.     sodium bicarbonate 650 MG tablet Take 650 mg by mouth 2 (two) times daily.     sulfamethoxazole-trimethoprim (BACTRIM,SEPTRA) 400-80 MG tablet Take 1 tablet by mouth 3 (three) times a week.     tacrolimus  (PROGRAF ) 1 MG capsule Take 3 mg by mouth daily.     tamsulosin (FLOMAX) 0.4 MG CAPS capsule Take 0.4 mg by mouth.     Current Facility-Administered Medications  Medication Dose Route Frequency Provider Last Rate Last Admin   0.9 %  sodium chloride  infusion  500 mL Intravenous Continuous Danis, Roel Clarity III, MD       0.9 %  sodium chloride  infusion  500 mL Intravenous Once Danis, Lasonia Casino L III, MD          ___________________________________________________________________ Objective   Exam:  BP (!) 135/92   Pulse 84   Ht 5\' 7"  (1.702 m)   Wt 165 lb (74.8 kg)   SpO2 98%   BMI 25.84 kg/m   CV: regular , S1/S2 Resp: clear to auscultation bilaterally, normal RR and  effort noted GI: soft, no tenderness, with active bowel sounds. Patient reports transplanted kidney in LLQ, not palpable at this time  Assessment: Encounter Diagnosis  Name Primary?   History of colonic polyps Yes     Plan: Colonoscopy   The benefits and risks of the planned procedure(s) were described in detail with the patient or (when appropriate) their health care proxy.  Risks were outlined as including, but not limited to, bleeding, infection, perforation, adverse medication reaction leading to cardiac or pulmonary decompensation, pancreatitis (if ERCP).  The limitation of incomplete mucosal visualization was also discussed.  No guarantees or warranties were given.  The patient is appropriate for an endoscopic procedure in the ambulatory  setting.   - Lorella Roles, MD

## 2023-08-24 NOTE — Patient Instructions (Addendum)
   Handout on hemorrhoids given to you today    Continue previous diet & medications    YOU HAD AN ENDOSCOPIC PROCEDURE TODAY AT THE [facility]:   Refer to the procedure report that was given to you for any specific questions about what was found during the examination.  If the procedure report does not answer your questions, please call your gastroenterologist to clarify.  If you requested that your care partner not be given the details of your procedure findings, then the procedure report has been included in a sealed envelope for you to review at your convenience later.  YOU SHOULD EXPECT: Some feelings of bloating in the abdomen. Passage of more gas than usual.  Walking can help get rid of the air that was put into your GI tract during the procedure and reduce the bloating. If you had a lower endoscopy (such as a colonoscopy or flexible sigmoidoscopy) you may notice spotting of blood in your stool or on the toilet paper. If you underwent a bowel prep for your procedure, you may not have a normal bowel movement for a few days.  Please Note:  You might notice some irritation and congestion in your nose or some drainage.  This is from the oxygen used during your procedure.  There is no need for concern and it should clear up in a day or so.  SYMPTOMS TO REPORT IMMEDIATELY:  Following lower endoscopy (colonoscopy or flexible sigmoidoscopy):  Excessive amounts of blood in the stool  Significant tenderness or worsening of abdominal pains  Swelling of the abdomen that is new, acute  Fever of 100F or higher   For urgent or emergent issues, a gastroenterologist can be reached at any hour by calling [phone]. Do not use MyChart messaging for urgent concerns.    DIET:  We do recommend a small meal at first, but then you may proceed to your regular diet.  Drink plenty of fluids but you should avoid alcoholic beverages for 24 hours.  ACTIVITY:  You should plan to take it  easy for the rest of today and you should NOT DRIVE or use heavy machinery until tomorrow (because of the sedation medicines used during the test).    FOLLOW UP: Our staff will call the number listed on your records the next business day following your procedure.  We will call around 7:15- 8:00 am to check on you and address any questions or concerns that you may have regarding the information given to you following your procedure. If we do not reach you, we will leave a message.     If any biopsies were taken you will be contacted by phone or by letter within the next 1-3 weeks.  Please call us at [phone] if you have not heard about the biopsies in 3 weeks.    SIGNATURES/CONFIDENTIALITY: You and/or your care partner have signed paperwork which will be entered into your electronic medical record.  These signatures attest to the fact that that the information above on your After Visit Summary has been reviewed and is understood.  Full responsibility of the confidentiality of this discharge information lies with you and/or your care-partner.

## 2023-08-24 NOTE — Progress Notes (Signed)
 Report to PACU, RN, vss, BBS= Clear.

## 2023-08-27 ENCOUNTER — Telehealth: Payer: Self-pay

## 2023-08-27 NOTE — Telephone Encounter (Signed)
 Attempted f/u call. No answer, left VM.
